# Patient Record
Sex: Female | Born: 1990 | Race: White | Hispanic: No | Marital: Married | Smoking: Never smoker
Health system: Southern US, Community
[De-identification: ages and names within clinical notes are randomized; demographics above are authoritative.]

## PROBLEM LIST (undated history)

## (undated) ENCOUNTER — Inpatient Hospital Stay (HOSPITAL_COMMUNITY): Payer: Self-pay

## (undated) DIAGNOSIS — Z862 Personal history of diseases of the blood and blood-forming organs and certain disorders involving the immune mechanism: Secondary | ICD-10-CM

## (undated) DIAGNOSIS — I499 Cardiac arrhythmia, unspecified: Secondary | ICD-10-CM

## (undated) DIAGNOSIS — H348192 Central retinal vein occlusion, unspecified eye, stable: Secondary | ICD-10-CM

## (undated) DIAGNOSIS — Z86718 Personal history of other venous thrombosis and embolism: Secondary | ICD-10-CM

## (undated) DIAGNOSIS — D649 Anemia, unspecified: Secondary | ICD-10-CM

## (undated) DIAGNOSIS — R87629 Unspecified abnormal cytological findings in specimens from vagina: Secondary | ICD-10-CM

## (undated) HISTORY — DX: Personal history of diseases of the blood and blood-forming organs and certain disorders involving the immune mechanism: Z86.2

## (undated) HISTORY — PX: MR LOWER LEG RIGHT (ARMC HX): HXRAD1785

---

## 2011-03-18 HISTORY — PX: DILATION AND CURETTAGE OF UTERUS: SHX78

## 2016-08-29 LAB — OB RESULTS CONSOLE HIV ANTIBODY (ROUTINE TESTING): HIV: NONREACTIVE

## 2016-08-29 LAB — OB RESULTS CONSOLE RPR: RPR: NONREACTIVE

## 2016-08-29 LAB — OB RESULTS CONSOLE ANTIBODY SCREEN: ANTIBODY SCREEN: NEGATIVE

## 2016-08-29 LAB — OB RESULTS CONSOLE ABO/RH: RH TYPE: POSITIVE

## 2016-08-29 LAB — OB RESULTS CONSOLE GC/CHLAMYDIA
Chlamydia: NEGATIVE
Gonorrhea: NEGATIVE

## 2016-08-29 LAB — OB RESULTS CONSOLE RUBELLA ANTIBODY, IGM: Rubella: IMMUNE

## 2016-08-29 LAB — OB RESULTS CONSOLE HEPATITIS B SURFACE ANTIGEN: Hepatitis B Surface Ag: NEGATIVE

## 2016-09-03 ENCOUNTER — Encounter (HOSPITAL_COMMUNITY): Payer: Self-pay | Admitting: *Deleted

## 2016-09-04 ENCOUNTER — Encounter (HOSPITAL_COMMUNITY): Payer: Self-pay

## 2016-09-09 ENCOUNTER — Ambulatory Visit (HOSPITAL_COMMUNITY)
Admission: RE | Admit: 2016-09-09 | Discharge: 2016-09-09 | Disposition: A | Discharge status: Home / Self Care | Payer: 59 | Source: Ambulatory Visit | Attending: Obstetrics & Gynecology | Admitting: Obstetrics & Gynecology

## 2016-09-09 ENCOUNTER — Encounter (HOSPITAL_COMMUNITY): Payer: Self-pay

## 2016-09-09 VITALS — BP 117/73 | HR 86 | Wt 180.6 lb

## 2016-09-09 DIAGNOSIS — H34812 Central retinal vein occlusion, left eye, with macular edema: Secondary | ICD-10-CM

## 2016-09-09 DIAGNOSIS — Z3A09 9 weeks gestation of pregnancy: Secondary | ICD-10-CM

## 2016-09-09 DIAGNOSIS — Z8249 Family history of ischemic heart disease and other diseases of the circulatory system: Secondary | ICD-10-CM

## 2016-09-09 DIAGNOSIS — Z87898 Personal history of other specified conditions: Secondary | ICD-10-CM | POA: Diagnosis not present

## 2016-09-09 DIAGNOSIS — Z3491 Encounter for supervision of normal pregnancy, unspecified, first trimester: Secondary | ICD-10-CM | POA: Insufficient documentation

## 2016-09-09 HISTORY — DX: Central retinal vein occlusion, unspecified eye, stable: H34.8192

## 2016-09-09 HISTORY — DX: Unspecified abnormal cytological findings in specimens from vagina: R87.629

## 2016-09-09 NOTE — Progress Notes (Signed)
Maternal Fetal Medicine Consultation  I had the pleasure of seeing your patient Krystal Benitez for Maternal-Fetal Medicine consultation on 09/09/2016. As you know, Krystal Benitez is a 26 y.o. G1P0 at [redacted]w[redacted]d who presents for consultation regarding a history of a retinal vein occlusion in the left eye.  Krystal Benitez is feeling well today. She reports some nausea vomiting in this pregnancy. Her medical history is significant for the onset of cloudy vision last year. She was seen by Dr. 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 She was treated with several intraocular injections of Eylea, an anti-VEGF therapy, with good response. Her last treatment was in December 2017 with no further treatment planned. She was last seen by Dr. Allyne Gee in April of this year. At the time of her initial evaluation a full thrombophilia evaluation was performed. I do not have the results of this but Krystal Benitez tells me it was negative. Krystal Benitez reports that her vision has been stable.   Krystal Benitez's family is notable for multiple family members with a history of VTE on her father's side. Her father had a significant history of VTE with multiple events starting at age 61. She reports his thrombophilia evaluation was also negative.  Krystal Benitez record also notes a history of elevated blood pressure in the fall of 2017. She reports this was self-limited and has never happened before or since. Her blood pressure today is normal.   The remainder of Krystal Benitez medical history is unremarkable. Her past surgical history is significant for leg decompression for compartment syndrome. She takes prenatal vitamins, Diclegis, aspirin 325mg , prenatal vitamins, and omeprazole. She tells me she is taking heparin twice daily but her notes indicate low-molecular weight heparin. She is allergic to ancef and azithromycin. [name] denies alcohol, tobacco or other drug  use. Her family history is unremarkable except as noted above.  We discussed the following issues during her visit today: History of central retinal vein occlusion: We discussed that Eylea is an anti-angiogenic medication which is contraindicated in pregnancy. Pregnancy should be delayed for at least three months following an Eylea injection. Given that Krystal Benitez's last injection occurred almost 5 months prior to conception this should not affect the current pregnancy. We discussed the changes in vision that may accompany normal pregnancy. I encouraged Krystal Benitez to keep her scheduled follow-up appointment with Dr. Allyne Gee in July and that she should be seen promptly if any new signs or symptoms arise.  We also discussed that retinal vein occlusion has been associated with a risk for hypertensive disorders of pregnancy. Particularly given Krystal Benitez's history of elevated blood pressure in the fall, I recommend clinical vigilance for signs and symptoms of preeclampsia and consideration of sending a baseline serum creatinine and spot urine protein at her next prenatal visit.    We also discussed the association between thrombophilias and retinal vein occlusion. While Krystal Benitez's thrombophilia evaluation was negative, given her family history we discussed the likelihood that she may carry an unrecognized inherited thrombophilia. I think it is reasonable to continue prophylactic anticoagulation in this case. We discussed the risks of anticoagulation in pregnancy and at delivery. Unless delivery is indicated earlier, I recommend a planned induction of labor at 39 weeks so that anticoagulation may be stopped the night prior. I recommend continuing prophylaxis postpartum for six weeks. Mode of delivery should be dictated by the usual obstetric indications. I do not see any contraindication to valsalva unless  her ophthalmologist feels otherwise.  Thank you for the opportunity to be a part of the care of [employer] Benitez. Please  contact our office if we can be of further assistance. We are happy to see Krystal Benitez again in the future particularly if her ophthalmologist recommends treatment in pregnancy or additional questions or concerns arise.   I spent approximately 40 minutes with this patient with over 50% of time spent in face-to-face counseling.  Darlyn ReadEmily Kylina Vultaggio, MD Maternal-Fetal Medicine

## 2016-09-30 ENCOUNTER — Encounter (HOSPITAL_COMMUNITY): Payer: Self-pay

## 2016-10-01 ENCOUNTER — Encounter (HOSPITAL_COMMUNITY): Payer: Self-pay

## 2017-01-26 ENCOUNTER — Inpatient Hospital Stay (HOSPITAL_COMMUNITY)
Admission: AD | Admit: 2017-01-26 | Discharge: 2017-01-26 | Disposition: A | Discharge status: Home / Self Care | Payer: 59 | Source: Ambulatory Visit | Attending: Obstetrics and Gynecology | Admitting: Obstetrics and Gynecology

## 2017-01-26 ENCOUNTER — Encounter (HOSPITAL_COMMUNITY): Payer: Self-pay | Admitting: *Deleted

## 2017-01-26 DIAGNOSIS — R0602 Shortness of breath: Secondary | ICD-10-CM | POA: Diagnosis not present

## 2017-01-26 DIAGNOSIS — I471 Supraventricular tachycardia: Secondary | ICD-10-CM

## 2017-01-26 DIAGNOSIS — O26893 Other specified pregnancy related conditions, third trimester: Secondary | ICD-10-CM | POA: Diagnosis not present

## 2017-01-26 DIAGNOSIS — Z3A29 29 weeks gestation of pregnancy: Secondary | ICD-10-CM | POA: Diagnosis not present

## 2017-01-26 DIAGNOSIS — Z86718 Personal history of other venous thrombosis and embolism: Secondary | ICD-10-CM | POA: Diagnosis not present

## 2017-01-26 DIAGNOSIS — O99419 Diseases of the circulatory system complicating pregnancy, unspecified trimester: Secondary | ICD-10-CM

## 2017-01-26 DIAGNOSIS — Z7982 Long term (current) use of aspirin: Secondary | ICD-10-CM | POA: Insufficient documentation

## 2017-01-26 DIAGNOSIS — R030 Elevated blood-pressure reading, without diagnosis of hypertension: Secondary | ICD-10-CM | POA: Diagnosis present

## 2017-01-26 LAB — URINALYSIS, ROUTINE W REFLEX MICROSCOPIC
Bilirubin Urine: NEGATIVE
GLUCOSE, UA: NEGATIVE mg/dL
Hgb urine dipstick: NEGATIVE
Ketones, ur: NEGATIVE mg/dL
LEUKOCYTES UA: NEGATIVE
Nitrite: NEGATIVE
PROTEIN: NEGATIVE mg/dL
Specific Gravity, Urine: 1.008 (ref 1.005–1.030)
pH: 7 (ref 5.0–8.0)

## 2017-01-26 NOTE — MAU Provider Note (Signed)
Chief Complaint:  exhausted; Tachycardia; and Hypertension   First Provider Initiated Contact with Patient 01/26/17 1106      HPI: Krystal Benitez is a 26 y.o. G2P0010 at 2329w3d who presents to maternity admissions sent from the office for recent tachycardia and HTN.  She had an elevated HR in the office today of 120 and her BP was elevated so labwork was drawn in the office and she was sent to MAU for EKG.  She reports that her BP has been elevated at recent office visits so she was asked by her doctor to take her blood pressure at home. She took it yesterday and her BP was normal but her heart rate was 150. She took it a few times and it took ~2 hours for the heart rate to go below 100.  She denies any shortness of breath, chest pain, or palpitations. She denies any change in symptoms during the time of elevated HR. She does report some recent mild shortness of breath, starting 2 weeks ago, both at rest and when ambulating, but she thinks this may be pregnancy related. She reports some fatigue also increasing in the last 2-3 weeks. She is not sure if these are associated with the elevated HR.  She has not tried any treatments. Nothing makes this symptom better or worse.  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 She reports good fetal movement, denies cramping/contractions, LOF, vaginal bleeding, vaginal itching/burning, urinary symptoms, h/a, dizziness, n/v, or fever/chills.    HPI  Past Medical History: Past Medical History:  Diagnosis Date  . Retinal vein occlusion   . Vaginal Pap smear, abnormal     Past obstetric history: OB History  Gravida Para Term Preterm AB Living  2 0 0 0 1 0  SAB TAB Ectopic Multiple Live Births  1 0 0 0 0    # Outcome Date GA Lbr Len/2nd Weight Sex Delivery Anes PTL Lv  2 Current           1 SAB               Past Surgical History: Past Surgical History:  Procedure Laterality Date  . MR LOWER LEG RIGHT ([facility] HX)     muscular surgery    Family  History: History reviewed. No pertinent family history.  Social History: Social History   Tobacco Use  . Smoking status: Never Smoker  . Smokeless tobacco: Never Used  Substance Use Topics  . Alcohol use: No  . Drug use: No    Allergies:  Allergies  Allergen Reactions  . Ancef [Cefazolin]   . Zithromax [Azithromycin]     Meds:  No medications prior to admission.    ROS:  Review of Systems  Constitutional: Negative for chills, fatigue and fever.  Eyes: Negative for visual disturbance.  Respiratory: Negative for shortness of breath.   Cardiovascular: Negative for chest pain.  Gastrointestinal: Negative for abdominal pain, nausea and vomiting.  Genitourinary: Negative for difficulty urinating, dysuria, flank pain, pelvic pain, vaginal bleeding, vaginal discharge and vaginal pain.  Neurological: Negative for dizziness and headaches.  Psychiatric/Behavioral: Negative.      I have reviewed patient's Past Medical Hx, Surgical Hx, Family Hx, Social Hx, medications and allergies.   Physical Exam   Patient Vitals for the past 24 hrs:  BP Temp Temp src Pulse Resp SpO2  01/26/17 1326 117/82 98.8 F (37.1 C) Oral 98 16 98 %  01/26/17 1145 120/83 - - (!) 111 - 97 %  01/26/17 1130 113/84 - - (!) 112 - 95 %  01/26/17 1125 - - - - - 95 %  01/26/17 1120 - - - - - 94 %  01/26/17 1115 121/82 - - (!) 103 - 95 %  01/26/17 1100 124/81 - - 100 - 96 %  01/26/17 1056 - 98.5 F (36.9 C) Oral - 16 95 %  01/26/17 1055 130/82 - - (!) 106 - 100 %   Constitutional: Well-developed, well-nourished female in no acute distress.  HEART: tachycardia at 107, normal heart sounds, regular rhythm RESP: normal effort, lung sounds clear and equal bilaterally GI: Abd soft, non-tender, gravid appropriate for gestational age.  MS: Extremities nontender, no edema, normal ROM Neurologic: Alert and oriented x 4.  GU: Neg CVAT.     EKG: normal EKG, normal sinus rhythm, sinus tachycardia.  FHT:   Baseline 145 , moderate variability, accelerations present, isolated variable x 1 followed by reactive NST with accels Contractions: None on toco or to palpation   Labs: Results for orders placed or performed during the hospital encounter of 01/26/17 (from the past 24 hour(s))  Urinalysis, Routine w reflex microscopic     Status: Abnormal   Collection Time: 01/26/17 10:48 AM  Result Value Ref Range   Color, Urine YELLOW YELLOW   APPearance HAZY (A) CLEAR   Specific Gravity, Urine 1.008 1.005 - 1.030   pH 7.0 5.0 - 8.0   Glucose, UA NEGATIVE NEGATIVE mg/dL   Hgb urine dipstick NEGATIVE NEGATIVE   Bilirubin Urine NEGATIVE NEGATIVE   Ketones, ur NEGATIVE NEGATIVE mg/dL   Protein, ur NEGATIVE NEGATIVE mg/dL   Nitrite NEGATIVE NEGATIVE   Leukocytes, UA NEGATIVE NEGATIVE      Imaging:  No results found.  MAU Course/MDM: I have ordered labs and reviewed results.  NST reviewed and reactive Pt without acute shortness of breath and HR of 100-110 in MAU.  EKG with NSR and mild tachycardia with HR 106.   Consult Dr Renaldo FiddlerAdkins with presentation, exam findings and test results.  F/U in office in 2 days as scheduled, return to MAU with worsening symptoms Pt discharge with strict shortness of breath/tachycardia precautions.    Assessment: 1. Supraventricular tachycardia during pregnancy (HCC)   2. Shortness of breath due to pregnancy in third trimester     Plan: Discharge home Labor precautions and fetal kick counts Follow-up Information    Zelphia CairoAdkins, Gretchen, MD Follow up.   Specialty:  Obstetrics and Gynecology Why:  As scheduled this week, return to MAU as needed for worsening symptoms or emergencies. Contact information: [address], [address] [place] [address] [phone]          Allergies as of 01/26/2017      Reactions   Ancef [cefazolin]    Zithromax [azithromycin]       Medication List    TAKE these medications   aspirin 325 MG tablet Take 325 mg by  mouth daily.   DICLEGIS PO Take by mouth.   OMEPRAZOLE PO Take by mouth.   PRENATAL VITAMIN PO Take by mouth.       Sharen CounterLisa Leftwich-Kirby Certified Nurse-Midwife 01/26/2017 5:00 PM

## 2017-01-26 NOTE — MAU Note (Signed)
Patient has been checking her BP at home and her HR last night was 150.  Has been feeling "exhausted" but not like her heart was racing.  Today in the office HR was 120.  Was sent over for "EKG".  Reports feeling good fetal movement. Denies pain.

## 2017-01-29 ENCOUNTER — Encounter: Payer: Self-pay | Admitting: *Deleted

## 2017-02-02 ENCOUNTER — Ambulatory Visit (INDEPENDENT_AMBULATORY_CARE_PROVIDER_SITE_OTHER): Payer: 59 | Admitting: Cardiovascular Disease

## 2017-02-02 ENCOUNTER — Encounter: Payer: Self-pay | Admitting: Cardiovascular Disease

## 2017-02-02 ENCOUNTER — Telehealth: Payer: Self-pay

## 2017-02-02 VITALS — BP 115/72 | HR 112 | Ht 62.0 in | Wt 174.8 lb

## 2017-02-02 DIAGNOSIS — H348122 Central retinal vein occlusion, left eye, stable: Secondary | ICD-10-CM

## 2017-02-02 DIAGNOSIS — R000 Tachycardia, unspecified: Secondary | ICD-10-CM

## 2017-02-02 DIAGNOSIS — R011 Cardiac murmur, unspecified: Secondary | ICD-10-CM

## 2017-02-02 NOTE — Telephone Encounter (Signed)
SENT NOTES TO SCHEDULING 

## 2017-02-02 NOTE — Patient Instructions (Signed)
Medication Instructions:  Your physician recommends that you continue on your current medications as directed. Please refer to the Current Medication list given to you today.  Testing/Procedures: Your physician has requested that you have an echocardiogram. Echocardiography is a painless test that uses sound waves to create images of your heart. It provides your doctor with information about the size and shape of your heart and how well your heart's chambers and valves are working. This procedure takes approximately one hour. There are no restrictions for this procedure. [facility]  [facility]  Follow-Up: Your physician recommends that you schedule a follow-up appointment in: 4-6 weeks    Any Other Special Instructions Will Be Listed Below (If Applicable).     If you need a refill on your cardiac medications before your next appointment, please call your pharmacy.

## 2017-02-02 NOTE — Progress Notes (Signed)
Cardiology Office Note    Date:  02/08/2017   ID:  Aynsley Fleet, DOB Apr 08, 1990, MRN [id number]  PCP:  Raynelle Jan., MD  Cardiologist:  Nicki Guadalajara, MD   Chief Complaint  Patient presents with  . New Patient (Initial Visit)   Cardiology consultation through the courtesy of Dr. Candice Camp for evaluation of tachycardia during pregnancy.  History of Present Illness:  Krystal Benitez is a 26 y.o. female who is pregnant with her first child and is referred through the courtesy of Dr. Candice Camp for cardiology consultation and evaluation of recent tachycardia.  Krystal Benitez admits to good health throughout her life.  She is pregnant with her first child with a due date of 04/10/2017.  She has a prior history of retinal vein occlusion and has been on heparin during her pregnancy. During her pregnancy, she has lost 10 pounds from her prepregnancy weight.  She admits to eating well.  On 01/26/2017, she was admitted from the office to maternity admissions for evaluation of increased heart rate and hypertension.  Reportedly her heart rate at increased to 120 in the office and her blood pressure was elevated.  Prior to that evaluation, she had noticed that her heart rate at home often would be over 100.  She denied any abrupt onset and abrupt discontinuance of this heart rate elevation.  She also admits to some fatigue over the last several weeks associated with the increased heart rate.  When she was evaluated at the MAU .  An ECG was done on 01/26/2017 , which was interpreted as possible ectopic atrial rhythm.  However, by my review, the is clear.  Arm lead reversal with negative P waves in lead 1 and aVL and actually she was in normal sinus rhythm at 97 beats per minute without ectopy and with normal intervals.  Presently, she denies any caffeine use.  She denies recent recurrent episodes of significant tachycardia palpitations.  She denies chest tightness.  She is referred for evaluation.  Of note,  the patient's family history is notable for a father who had suffered multiple DVTs and also had hypertension.   Past Medical History:  Diagnosis Date  . History of anemia as a child   . Retinal vein occlusion   . Vaginal Pap smear, abnormal     Past Surgical History:  Procedure Laterality Date  . DILATION AND CURETTAGE OF UTERUS  2013  . MR LOWER LEG RIGHT ([facility] HX)     muscular surgery    Current Medications: Outpatient Medications Prior to Visit  Medication Sig Dispense Refill  . aspirin 325 MG tablet Take 325 mg by mouth daily.    . heparin 40981 UNIT/ML injection Inject 1,250 Units 2 (two) times daily into the skin.  0  . OMEPRAZOLE PO Take by mouth.    . Prenatal Vit-Fe Fumarate-FA (PRENATAL VITAMIN PO) Take by mouth.    . Doxylamine-Pyridoxine (DICLEGIS PO) Take by mouth.     No facility-administered medications prior to visit.      Allergies:   Azithromycin and Cefazolin   Social History   Socioeconomic History  . Marital status: Single    Spouse name: None  . Number of children: None  . Years of education: None  . Highest education level: None  Social Needs  . Financial resource strain: None  . Food insecurity - worry: None  . Food insecurity - inability: None  . Transportation needs - medical: None  . Transportation needs - non-medical:  None  Occupational History  . None  Tobacco Use  . Smoking status: Never Smoker  . Smokeless tobacco: Never Used  Substance and Sexual Activity  . Alcohol use: No  . Drug use: No  . Sexual activity: Yes  Other Topics Concern  . None  Social History Narrative  . None     Family History:  The patient's family history includes COPD in her maternal grandfather; Clotting disorder in her father; Heart disease in her father; Hypertension in her father; Rheum arthritis in her paternal grandmother.   ROS General: Negative; No fevers, chills, or night sweats;  HEENT: Negative; No changes in vision or hearing, sinus  congestion, difficulty swallowing Pulmonary: Negative; No cough, wheezing, shortness of breath, hemoptysis Cardiovascular: See history of present illness GI: Negative; No nausea, vomiting, diarrhea, or abdominal pain GU: Negative; No dysuria, hematuria, or difficulty voiding Musculoskeletal: Negative; no myalgias, joint pain, or weakness Hematologic/Oncology: Negative; no easy bruising, bleeding Endocrine: Negative; no heat/cold intolerance; no diabetes Neuro: Negative; no changes in balance, headaches Skin: Negative; No rashes or skin lesions Psychiatric: Negative; No behavioral problems, depression Sleep: Negative; No snoring, daytime sleepiness, hypersomnolence, bruxism, restless legs, hypnogognic hallucinations, no cataplexy Other comprehensive 14 point system review is negative.   PHYSICAL EXAM:   VS:  BP 115/72   Pulse (!) 112   Ht 5\' 2"  (1.575 m)   Wt 174 lb 12.8 oz (79.3 kg)   LMP 07/04/2016   BMI 31.97 kg/m     Repeat blood pressure by me was 108/70 supine and 106/70 standing.  Wt Readings from Last 3 Encounters:  02/02/17 174 lb 12.8 oz (79.3 kg)  09/09/16 180 lb 9.6 oz (81.9 kg)    General: Alert, oriented, no distress.  Skin: normal turgor, no rashes, warm and dry HEENT: Normocephalic, atraumatic. Pupils equal round and reactive to light; sclera anicteric; extraocular muscles intact; Fundi .  She has a history of left retinal vein occlusion. Nose without nasal septal hypertrophy Mouth/Parynx benign; Mallinpatti scale 2 Neck: No JVD, no carotid bruits; normal carotid upstroke Lungs: clear to ausculatation and percussion; no wheezing or rales Chest wall: without tenderness to palpitation Heart: PMI not displaced, RRR, s1 s2 normal, 1/6 systolic murmur along the left sternal border, no diastolic murmur, no rubs, gallops, thrills, or heaves Abdomen: soft, nontender; no hepatosplenomehaly, BS+; abdominal aorta nontender and not dilated by palpation. .  She is [redacted] weeks  pregnant. Back: no CVA tenderness Pulses 2+ Musculoskeletal: full range of motion, normal strength, no joint deformities Extremities: no clubbing cyanosis or edema, Homan's sign negative  Neurologic: grossly nonfocal; Cranial nerves grossly wnl Psychologic: Normal mood and affect   Studies/Labs Reviewed:   EKG:  EKG is ordered today.  ECG (independently read by me): Sinus tachycardia 112 bpm.  Normal intervals.  No evidence for preexcitation.  Normal axis.  No ST segment changes.  No ectopy.  I personally reviewed the ECG that was done 01/26/2017 at the MAU:  There is arm lead reversal with an inverted P wave in lead 1.  Rightward axis secondary to lead reversal.  There is normal sinus rhythm at 97 bpm.  QTc interval 419 ms.  PR interval 128 ms.  Recent Labs: No flowsheet data found.   No flowsheet data found.  No flowsheet data found. No results found for: MCV No results found for: TSH No results found for: HGBA1C   BNP No results found for: BNP  ProBNP No results found for: PROBNP   Lipid  Panel  No results found for: CHOL, TRIG, HDL, CHOLHDL, VLDL, LDLCALC, LDLDIRECT   RADIOLOGY: No results found.   Additional studies/ records that were reviewed today include:  I reviewed the office records from Dr. Candice Campavid Lowe at [facility].  I reviewed the maternal ambulatory unit evaluation from 01/26/2017.  ASSESSMENT:    1. Tachycardia   2. Murmur, cardiac   3. Retinal vein occlusion of left eye, unspecified retinal vein     PLAN:  Krystal Benitez is a healthy-appearing 26 year old female who is in her second pregnancy after having a miscarriage 6 years ago.  She is currently [redacted] weeks pregnant with a due date of April 10 2017.  She has a history of prior left retinal vein occlusion for which she was placed on heparin therapy.  The latter stages of her pregnancy.  She denies any history of prior tachycardia, presyncope or syncope or history of SVT.  She had  recently developed increased heart rate with tachycardia with heart rates in the 120s with questionable episode of heart rate at 150.  She denies any abrupt onset or abrupt discontinuation arguing against SVT.  There is no evidence for preexcitation on her ECG.  Her ECG from 01/26/2017 had arm lead reversal and at that time she was in sinus rhythm and not ectopic atrial tachycardia.  She has been avoiding caffeine.  She has not had weight gain but actually admits to a 10 pound weight loss during her pregnancy.  She does have a 1/6 systolic murmur, which I suspect is most likely due to the increased blood volume associated with her pregnancy.  I scheduled her for 2-D echo Doppler study to make certain there is no evidence for structural heart disease.  At this time, I would not initiate any therapy for her tachycardia.  Her blood pressure is normal and there is no suggestion of preeclampsia at present.  I will see her back in the office in 4-6 weeks for follow-up evaluation.  If she experiences recurrent episodes of increasing heart rate.  She will contact us for sooner assessment.   Medication Adjustments/Labs and Tests Ordered: Current medicines are reviewed at length with the patient today.  Concerns regarding medicines are outlined above.  Medication changes, Labs and Tests ordered today are listed in the Patient Instructions below. Patient Instructions  Medication Instructions:  Your physician recommends that you continue on your current medications as directed. Please refer to the Current Medication list given to you today.  Testing/Procedures: Your physician has requested that you have an echocardiogram. Echocardiography is a painless test that uses sound waves to create images of your heart. It provides your doctor with information about the size and shape of your heart and how well your heart's chambers and valves are working. This procedure takes approximately one hour. There are no restrictions  for this procedure. [facility]  [facility]  Follow-Up: Your physician recommends that you schedule a follow-up appointment in: 4-6 weeks    Any Other Special Instructions Will Be Listed Below (If Applicable).     If you need a refill on your cardiac medications before your next appointment, please call your pharmacy.      Signed, Nicki Guadalajarahomas Marchello Rothgeb, MD  02/08/2017 9:11 AM    [facility] [address], [address], [place], [place]  [address] Phone: [phone]

## 2017-02-03 ENCOUNTER — Ambulatory Visit: Payer: 59 | Admitting: Cardiovascular Disease

## 2017-02-08 ENCOUNTER — Encounter: Payer: Self-pay | Admitting: Cardiovascular Disease

## 2017-02-23 ENCOUNTER — Other Ambulatory Visit (HOSPITAL_COMMUNITY): Payer: 59

## 2017-03-04 ENCOUNTER — Other Ambulatory Visit: Payer: Self-pay

## 2017-03-04 ENCOUNTER — Telehealth: Payer: Self-pay | Admitting: Cardiovascular Disease

## 2017-03-04 ENCOUNTER — Ambulatory Visit (HOSPITAL_COMMUNITY): Payer: 59 | Attending: Internal Medicine

## 2017-03-04 DIAGNOSIS — R000 Tachycardia, unspecified: Secondary | ICD-10-CM | POA: Insufficient documentation

## 2017-03-04 DIAGNOSIS — R011 Cardiac murmur, unspecified: Secondary | ICD-10-CM | POA: Diagnosis not present

## 2017-03-04 NOTE — Telephone Encounter (Signed)
Called patient and left a VM to call back to schedule her followup after her echo with Dr. Tresa EndoKelly (if available) or one of the PA's on Dr. Landry DykeKelly's team.

## 2017-03-09 ENCOUNTER — Telehealth: Payer: Self-pay | Admitting: Cardiovascular Disease

## 2017-03-09 NOTE — Telephone Encounter (Signed)
Called patient and LVM to call back and schedule 6 week followup with Dr. Tresa EndoKelly or APP.

## 2017-03-11 ENCOUNTER — Telehealth: Payer: Self-pay | Admitting: *Deleted

## 2017-03-11 NOTE — Telephone Encounter (Signed)
-----   Message from Lennette Biharihomas A Kelly, MD sent at 03/06/2017  1:29 PM EST ----- Normal echo Doppler study

## 2017-03-11 NOTE — Telephone Encounter (Signed)
Notes recorded by Lennette BihariKelly, Thomas A, MD on 03/06/2017 at 1:29 PM EST Normal echo Doppler study   Patient aware and verbalized understanding.  Patient wondering if follow up OV is needed.  Advised I am unsure if it is needed since it is normal but would let Dr. Tresa EndoKelly review and give recommendations.   Patient aware we will call if OV is recommended.

## 2017-03-12 NOTE — Telephone Encounter (Signed)
NO need for ov; can follow PRN

## 2017-03-13 NOTE — Telephone Encounter (Signed)
Patient is aware 

## 2017-03-23 ENCOUNTER — Encounter (HOSPITAL_COMMUNITY): Payer: Self-pay

## 2017-03-24 ENCOUNTER — Telehealth (HOSPITAL_COMMUNITY): Payer: Self-pay | Admitting: *Deleted

## 2017-03-24 NOTE — Telephone Encounter (Signed)
Preadmission screen  

## 2017-03-25 ENCOUNTER — Telehealth (HOSPITAL_COMMUNITY): Payer: Self-pay | Admitting: *Deleted

## 2017-03-25 NOTE — Telephone Encounter (Signed)
Preadmission screen  

## 2017-03-26 ENCOUNTER — Telehealth (HOSPITAL_COMMUNITY): Payer: Self-pay | Admitting: *Deleted

## 2017-03-26 NOTE — Telephone Encounter (Signed)
Preadmission screen  

## 2017-03-27 ENCOUNTER — Encounter (HOSPITAL_COMMUNITY): Payer: Self-pay

## 2017-03-27 NOTE — Patient Instructions (Addendum)
Krystal Benitez  03/27/2017   Your procedure is scheduled on:  04/03/2017  [facility] through [facility] at 1200 PM.  Pick up the phone at the desk and dial [phone]  Call this number if you have problems the morning of surgery:[phone]  Remember:   Do not eat food:After Midnight.  Do not drink clear liquids: After Midnight.  Take these medicines the morning of surgery with A SIP OF WATER: take your heparin on 1/17 as usual but do not take any heparin on day of surgery. You may take your prilosec if you wish to.   Do not wear jewelry, make-up or nail polish.  Do not wear lotions, powders, or perfumes. Do not wear deodorant.  Do not shave 48 hours prior to surgery.  Do not bring valuables to the hospital.  [facility] is not   responsible for any belongings or valuables brought to the hospital.  Contacts, dentures or bridgework may not be worn into surgery.  Leave suitcase in the car. After surgery it may be brought to your room.  For patients admitted to the hospital, checkout time is 11:00 AM the day of              discharge.    N/A   Please read over the following fact sheets that you were given:   Surgical Site Infection Prevention

## 2017-03-27 NOTE — Pre-Procedure Instructions (Signed)
Discussed pt heparin history and medical history with Dr Mal AmabileBrock today.  Orders received to do a PT/PTT on the day of surgery.  The pt is to take both of her doses of heparin the day before surgery but hold the dose the morning of surgery.  I discussed this plan with the patient.  She voiced concerns because the plan is different than the one her OB had discussed.  Told pt I would contact her OB and let her know the plan.  Dr Langston MaskerMorris' voicemail was full.  Left message on the schedulers voice mail and will follow up with office on Monday.

## 2017-03-27 NOTE — Pre-Procedure Instructions (Signed)
Contacted Dr Langston MaskerMorris updated to POC of Anesthesia.  Pt notified of Dr Langston MaskerMorris approval of POC.

## 2017-03-28 NOTE — H&P (Addendum)
Krystal Benitez is a 27 y.o. female presenting for primary C/S secondary to breech presentation; patient declines ECV.  Antepartum course complicated by history of retinal vein occlusion (negative hypercoagulability w/u) for which she has taken ppx unfractionated heparin and daily aspirin.  She received chemo treatment Connye Burkitt([place]) through 02/2016 and MFM concluded no risk to pregnancy.  The patient was was evaluated by endocrinology for possible thyroid disease. Ultimately, she was diagnosed with dysthymia and no meds were started.  GBS negative.  OB History    Gravida Para Term Preterm AB Living   2 0 0 0 1 0   SAB TAB Ectopic Multiple Live Births   1 0 0 0 0     Past Medical History:  Diagnosis Date  . Anemia   . Dysrhythmia    tachycardia  . History of anemia as a child   . Hx of blood clots   . Retinal vein occlusion   . Vaginal Pap smear, abnormal    Past Surgical History:  Procedure Laterality Date  . DILATION AND CURETTAGE OF UTERUS  2013  . MR LOWER LEG RIGHT ([facility] HX)     muscular surgery   Family History: family history includes COPD in her maternal grandfather; Clotting disorder in her father; Heart disease in her father; Hypertension in her father; Pancreatic cancer in her paternal grandmother; Rheum arthritis in her paternal grandmother. Social History:  reports that  has never smoked. she has never used smokeless tobacco. She reports that she does not drink alcohol or use drugs.     Maternal Diabetes: No Genetic Screening: Normal Maternal Ultrasounds/Referrals: Normal Fetal Ultrasounds or other Referrals:  Referred to Materal Fetal Medicine  Maternal Substance Abuse:  No Significant Maternal Medications:  Meds include: Other: unfractionated heparin Significant Maternal Lab Results:  Lab values include: Group B Strep negative Other Comments:  None  ROS Maternal Medical History:  Prenatal complications: Thrombophilia.   Prenatal Complications - Diabetes:  none.      Last menstrual period 07/04/2016. Maternal Exam:  Abdomen: Fundal height is c/w dates.   Estimated fetal weight is 7#.   Fetal presentation: breech     Physical Exam  Constitutional: She is oriented to person, place, and time. She appears well-developed and well-nourished.  GI: Soft. There is no tenderness. There is no rebound.  Neurological: She is alert and oriented to person, place, and time.  Skin: Skin is warm and dry.  Psychiatric: She has a normal mood and affect. Her behavior is normal.    Prenatal labs: ABO, Rh: O/Positive/-- (06/15 0000) Antibody: Negative (06/15 0000) Rubella: Immune (06/15 0000) RPR: Nonreactive (06/15 0000)  HBsAg: Negative (06/15 0000)  HIV: Non-reactive (06/15 0000)  GBS:     Assessment/Plan: 26yo G2P0010 at 39 weeks for primary C/S -Patient has been counseled re: risk of bleeding, infection, scarring, and damage to surrounding structures.  She understands implications in future pregnancies; specifically abnormal placentation and uterine rupture.  All questions were answered and the patient wishes to proceed. -Plan ufh x 6 weeks ppx (10,000U sq q 12)   Tamiki Kuba 03/28/2017, 9:52 PM

## 2017-03-29 ENCOUNTER — Inpatient Hospital Stay (HOSPITAL_COMMUNITY)
Admission: AD | Admit: 2017-03-29 | Discharge: 2017-03-30 | Disposition: A | Discharge status: Home / Self Care | Payer: BLUE CROSS/BLUE SHIELD | Source: Ambulatory Visit | Attending: Obstetrics and Gynecology | Admitting: Obstetrics and Gynecology

## 2017-03-29 ENCOUNTER — Encounter (HOSPITAL_COMMUNITY): Payer: Self-pay | Admitting: *Deleted

## 2017-03-29 DIAGNOSIS — Z3A38 38 weeks gestation of pregnancy: Secondary | ICD-10-CM | POA: Insufficient documentation

## 2017-03-29 DIAGNOSIS — O368130 Decreased fetal movements, third trimester, not applicable or unspecified: Secondary | ICD-10-CM | POA: Insufficient documentation

## 2017-03-29 DIAGNOSIS — Z86718 Personal history of other venous thrombosis and embolism: Secondary | ICD-10-CM | POA: Insufficient documentation

## 2017-03-29 DIAGNOSIS — Z7982 Long term (current) use of aspirin: Secondary | ICD-10-CM | POA: Insufficient documentation

## 2017-03-29 NOTE — MAU Note (Signed)
Urine sent to lab 

## 2017-03-29 NOTE — MAU Note (Signed)
Pt reports not feeling any fetal movement for the past 24 hours.  She denies any vag bleeding, leaking or pain anywhere.

## 2017-03-29 NOTE — MAU Provider Note (Signed)
History     CSN: [id number]  Arrival date and time: 03/29/17 2308   First Provider Initiated Contact with Patient 03/29/17 2348     Chief Complaint  Patient presents with  . Decreased Fetal Movement   HPI Krystal Benitez is a 27 y.o. G2P0010 at 5023w2d who presents stating she hasn't felt the baby move in the last 24 hours. She states she noticed at 2130 that she hadn't felt the baby move since last night so she waited to see if the baby started moving and has felt nothing. She denies any vaginal bleeding or discharge. Denies leaking of fluid. Reports intermittent contractions. She is scheduled for a primary c/s on 1/18 for breech presentation.  OB History    Gravida Para Term Preterm AB Living   2 0 0 0 1 0   SAB TAB Ectopic Multiple Live Births   1 0 0 0 0      Past Medical History:  Diagnosis Date  . Anemia   . Dysrhythmia    tachycardia  . History of anemia as a child   . Hx of blood clots   . Retinal vein occlusion   . Vaginal Pap smear, abnormal     Past Surgical History:  Procedure Laterality Date  . DILATION AND CURETTAGE OF UTERUS  2013  . MR LOWER LEG RIGHT ([facility] HX)     muscular surgery    Family History  Problem Relation Age of Onset  . Heart disease Father   . Hypertension Father   . Clotting disorder Father        MULTIPLE BLOOD CLOTS. ON HEPARIN , ASA  . COPD Maternal Grandfather   . Rheum arthritis Paternal Grandmother   . Pancreatic cancer Paternal Grandmother     Social History   Tobacco Use  . Smoking status: Never Smoker  . Smokeless tobacco: Never Used  Substance Use Topics  . Alcohol use: No  . Drug use: No    Allergies:  Allergies  Allergen Reactions  . Azithromycin Hives  . Cefazolin Hives and Rash    Medications Prior to Admission  Medication Sig Dispense Refill Last Dose  . acetaminophen (TYLENOL) 325 MG tablet Take 650 mg by mouth 2 (two) times daily as needed for moderate pain or headache.   Past Month at Unknown time  .  aspirin 325 MG tablet Take 325 mg by mouth daily.   03/29/2017 at Unknown time  . doxylamine, Sleep, (UNISOM) 25 MG tablet Take 25 mg by mouth at bedtime as needed for sleep.   03/29/2017 at Unknown time  . heparin [id number] UNIT/ML injection Inject 12,500 Units into the skin every 12 (twelve) hours.   0 03/29/2017 at Unknown time  . omeprazole (PRILOSEC) 40 MG capsule Take 40 mg by mouth daily as needed (acid reflux).   03/28/2017 at Unknown time  . Prenatal Vit-Fe Fumarate-FA (PRENATAL VITAMIN PO) Take 1 tablet by mouth daily.    03/29/2017 at Unknown time    Review of Systems  Constitutional: Negative.  Negative for fatigue and fever.  HENT: Negative.   Respiratory: Negative.  Negative for shortness of breath.   Cardiovascular: Negative.  Negative for chest pain.  Gastrointestinal: Negative.  Negative for abdominal pain, constipation, diarrhea, nausea and vomiting.  Genitourinary: Negative.  Negative for dysuria.  Neurological: Negative.  Negative for dizziness and headaches.   Physical Exam   Blood pressure 122/85, pulse 96, temperature 97.8 F (36.6 C), temperature source Oral, resp. rate 16, height 5'  2" (1.575 m), weight 177 lb (80.3 kg), last menstrual period 07/04/2016.  Physical Exam  Nursing note and vitals reviewed. Constitutional: She is oriented to person, place, and time. She appears well-developed and well-nourished. No distress.  HENT:  Head: Normocephalic.  Eyes: Pupils are equal, round, and reactive to light.  Cardiovascular: Normal rate, regular rhythm and normal heart sounds.  Respiratory: Effort normal and breath sounds normal. No respiratory distress.  GI: Soft. Bowel sounds are normal. She exhibits no distension. There is no tenderness.  Neurological: She is alert and oriented to person, place, and time.  Skin: Skin is warm and dry.  Psychiatric: She has a normal mood and affect. Her behavior is normal. Judgment and thought content normal.   Fetal  Tracing:  Baseline: 120 Variability: moderate Accels: 15x15 Decels: none  Toco: irregular uc's  MAU Course  Procedures  MDM NST- reactive Patient reports normal fetal movement while on monitor in MAU Reviewed with Dr. Vincente Poli- ok to discharge patient home to return for c/s Assessment and Plan   1. Decreased fetal movements in third trimester, single or unspecified fetus   2. [redacted] weeks gestation of pregnancy    -Discharge home in stable condition -Fetal kick counts discussed -Patient advised to follow-[facility] with [facility] on Friday for scheduled c/s -Patient may return to MAU as needed or if her condition were to change or worsen   Rolm Bookbinder CNM 03/30/2017, 12:22 AM

## 2017-03-30 DIAGNOSIS — Z7982 Long term (current) use of aspirin: Secondary | ICD-10-CM | POA: Diagnosis not present

## 2017-03-30 DIAGNOSIS — Z86718 Personal history of other venous thrombosis and embolism: Secondary | ICD-10-CM | POA: Diagnosis not present

## 2017-03-30 DIAGNOSIS — O368130 Decreased fetal movements, third trimester, not applicable or unspecified: Secondary | ICD-10-CM

## 2017-03-30 DIAGNOSIS — Z3A38 38 weeks gestation of pregnancy: Secondary | ICD-10-CM | POA: Diagnosis not present

## 2017-03-30 NOTE — Discharge Instructions (Signed)
Braxton Hicks Contractions °Contractions of the uterus can occur throughout pregnancy, but they are not always a sign that you are in labor. You may have practice contractions called Braxton Hicks contractions. These false labor contractions are sometimes confused with true labor. °What are Braxton Hicks contractions? °Braxton Hicks contractions are tightening movements that occur in the muscles of the uterus before labor. Unlike true labor contractions, these contractions do not result in opening (dilation) and thinning of the cervix. Toward the end of pregnancy (32-34 weeks), Braxton Hicks contractions can happen more often and may become stronger. These contractions are sometimes difficult to tell apart from true labor because they can be very uncomfortable. You should not feel embarrassed if you go to the hospital with false labor. °Sometimes, the only way to tell if you are in true labor is for your health care provider to look for changes in the cervix. The health care provider will do a physical exam and may monitor your contractions. If you are not in true labor, the exam should show that your cervix is not dilating and your water has not broken. °If there are other health problems associated with your pregnancy, it is completely safe for you to be sent home with false labor. You may continue to have Braxton Hicks contractions until you go into true labor. °How to tell the difference between true labor and false labor °True labor °· Contractions last 30-70 seconds. °· Contractions become very regular. °· Discomfort is usually felt in the top of the uterus, and it spreads to the lower abdomen and low back. °· Contractions do not go away with walking. °· Contractions usually become more intense and increase in frequency. °· The cervix dilates and gets thinner. °False labor °· Contractions are usually shorter and not as strong as true labor contractions. °· Contractions are usually irregular. °· Contractions  are often felt in the front of the lower abdomen and in the groin. °· Contractions may go away when you walk around or change positions while lying down. °· Contractions get weaker and are shorter-lasting as time goes on. °· The cervix usually does not dilate or become thin. °Follow these instructions at home: °· Take over-the-counter and prescription medicines only as told by your health care provider. °· Keep up with your usual exercises and follow other instructions from your health care provider. °· Eat and drink lightly if you think you are going into labor. °· If Braxton Hicks contractions are making you uncomfortable: °? Change your position from lying down or resting to walking, or change from walking to resting. °? Sit and rest in a tub of warm water. °? Drink enough fluid to keep your urine pale yellow. Dehydration may cause these contractions. °? Do slow and deep breathing several times an hour. °· Keep all follow-up prenatal visits as told by your health care provider. This is important. °Contact a health care provider if: °· You have a fever. °· You have continuous pain in your abdomen. °Get help right away if: °· Your contractions become stronger, more regular, and closer together. °· You have fluid leaking or gushing from your vagina. °· You pass blood-tinged mucus (bloody show). °· You have bleeding from your vagina. °· You have low back pain that you never had before. °· You feel your baby’s head pushing down and causing pelvic pressure. °· Your baby is not moving inside you as much as it used to. °Summary °· Contractions that occur before labor are called Braxton   Hicks contractions, false labor, or practice contractions. °· Braxton Hicks contractions are usually shorter, weaker, farther apart, and less regular than true labor contractions. True labor contractions usually become progressively stronger and regular and they become more frequent. °· Manage discomfort from Braxton Hicks contractions by  changing position, resting in a warm bath, drinking plenty of water, or practicing deep breathing. °This information is not intended to replace advice given to you by your health care provider. Make sure you discuss any questions you have with your health care provider. °Document Released: 07/17/2016 Document Revised: 07/17/2016 Document Reviewed: 07/17/2016 °Elsevier Interactive Patient Education © 2018 [employer] ° °Fetal Movement Counts °Patient Name: ________________________________________________ Patient Due Date: ____________________ °What is a fetal movement count? °A fetal movement count is the number of times that you feel your baby move during a certain amount of time. This may also be called a fetal kick count. A fetal movement count is recommended for every pregnant woman. You may be asked to start counting fetal movements as early as week 28 of your pregnancy. °Pay attention to when your baby is most active. You may notice your baby's sleep and wake cycles. You may also notice things that make your baby move more. You should do a fetal movement count: °· When your baby is normally most active. °· At the same time each day. ° °A good time to count movements is while you are resting, after having something to eat and drink. °How do I count fetal movements? °1. Find a quiet, comfortable area. Sit, or lie down on your side. °2. Write down the date, the start time and stop time, and the number of movements that you felt between those two times. Take this information with you to your health care visits. °3. For 2 hours, count kicks, flutters, swishes, rolls, and jabs. You should feel at least 10 movements during 2 hours. °4. You may stop counting after you have felt 10 movements. °5. If you do not feel 10 movements in 2 hours, have something to eat and drink. Then, keep resting and counting for 1 hour. If you feel at least 4 movements during that hour, you may stop counting. °Contact a health care  provider if: °· You feel fewer than 4 movements in 2 hours. °· Your baby is not moving like he or she usually does. °Date: ____________ Start time: ____________ Stop time: ____________ Movements: ____________ °Date: ____________ Start time: ____________ Stop time: ____________ Movements: ____________ °Date: ____________ Start time: ____________ Stop time: ____________ Movements: ____________ °Date: ____________ Start time: ____________ Stop time: ____________ Movements: ____________ °Date: ____________ Start time: ____________ Stop time: ____________ Movements: ____________ °Date: ____________ Start time: ____________ Stop time: ____________ Movements: ____________ °Date: ____________ Start time: ____________ Stop time: ____________ Movements: ____________ °Date: ____________ Start time: ____________ Stop time: ____________ Movements: ____________ °Date: ____________ Start time: ____________ Stop time: ____________ Movements: ____________ °This information is not intended to replace advice given to you by your health care provider. Make sure you discuss any questions you have with your health care provider. °Document Released: 04/02/2006 Document Revised: 10/31/2015 Document Reviewed: 04/12/2015 °Elsevier Interactive Patient Education © 2018 [employer] ° °

## 2017-04-02 ENCOUNTER — Encounter (HOSPITAL_COMMUNITY)
Admission: RE | Admit: 2017-04-02 | Discharge: 2017-04-02 | Disposition: A | Discharge status: Home / Self Care | Payer: BLUE CROSS/BLUE SHIELD | Source: Ambulatory Visit | Attending: Obstetrics & Gynecology | Admitting: Obstetrics & Gynecology

## 2017-04-02 HISTORY — DX: Personal history of other venous thrombosis and embolism: Z86.718

## 2017-04-02 HISTORY — DX: Anemia, unspecified: D64.9

## 2017-04-02 HISTORY — DX: Cardiac arrhythmia, unspecified: I49.9

## 2017-04-02 LAB — CBC
HEMATOCRIT: 38.5 % (ref 36.0–46.0)
Hemoglobin: 13.2 g/dL (ref 12.0–15.0)
MCH: 28.6 pg (ref 26.0–34.0)
MCHC: 34.3 g/dL (ref 30.0–36.0)
MCV: 83.5 fL (ref 78.0–100.0)
PLATELETS: 266 10*3/uL (ref 150–400)
RBC: 4.61 MIL/uL (ref 3.87–5.11)
RDW: 15.7 % — ABNORMAL HIGH (ref 11.5–15.5)
WBC: 12.1 10*3/uL — AB (ref 4.0–10.5)

## 2017-04-02 LAB — TYPE AND SCREEN
ABO/RH(D): O POS
ANTIBODY SCREEN: NEGATIVE

## 2017-04-02 LAB — ABO/RH: ABO/RH(D): O POS

## 2017-04-03 ENCOUNTER — Encounter (HOSPITAL_COMMUNITY): Payer: Self-pay | Admitting: *Deleted

## 2017-04-03 ENCOUNTER — Inpatient Hospital Stay (HOSPITAL_COMMUNITY): Payer: BLUE CROSS/BLUE SHIELD | Admitting: Certified Registered"

## 2017-04-03 ENCOUNTER — Encounter (HOSPITAL_COMMUNITY)
Admission: AD | Disposition: A | Discharge status: Home / Self Care | Payer: Self-pay | Source: Ambulatory Visit | Attending: Obstetrics & Gynecology

## 2017-04-03 ENCOUNTER — Inpatient Hospital Stay (HOSPITAL_COMMUNITY)
Admission: AD | Admit: 2017-04-03 | Discharge: 2017-04-06 | DRG: 788 | Disposition: A | Discharge status: Home / Self Care | Payer: BLUE CROSS/BLUE SHIELD | Source: Ambulatory Visit | Attending: Obstetrics & Gynecology | Admitting: Obstetrics & Gynecology

## 2017-04-03 DIAGNOSIS — O9952 Diseases of the respiratory system complicating childbirth: Secondary | ICD-10-CM | POA: Diagnosis present

## 2017-04-03 DIAGNOSIS — O321XX0 Maternal care for breech presentation, not applicable or unspecified: Secondary | ICD-10-CM | POA: Diagnosis present

## 2017-04-03 DIAGNOSIS — J019 Acute sinusitis, unspecified: Secondary | ICD-10-CM | POA: Diagnosis present

## 2017-04-03 DIAGNOSIS — Z3A39 39 weeks gestation of pregnancy: Secondary | ICD-10-CM | POA: Diagnosis not present

## 2017-04-03 DIAGNOSIS — Z98891 History of uterine scar from previous surgery: Secondary | ICD-10-CM

## 2017-04-03 LAB — CBC
HCT: 32.7 % — ABNORMAL LOW (ref 36.0–46.0)
Hemoglobin: 11.3 g/dL — ABNORMAL LOW (ref 12.0–15.0)
MCH: 29 pg (ref 26.0–34.0)
MCHC: 34.6 g/dL (ref 30.0–36.0)
MCV: 84.1 fL (ref 78.0–100.0)
PLATELETS: 227 10*3/uL (ref 150–400)
RBC: 3.89 MIL/uL (ref 3.87–5.11)
RDW: 15.5 % (ref 11.5–15.5)
WBC: 13.9 10*3/uL — ABNORMAL HIGH (ref 4.0–10.5)

## 2017-04-03 LAB — CREATININE, SERUM
Creatinine, Ser: 0.69 mg/dL (ref 0.44–1.00)
GFR calc Af Amer: >60 mL/min (ref >60)
GFR calc non Af Amer: >60 mL/min (ref >60)

## 2017-04-03 LAB — RPR: RPR Ser Ql: NONREACTIVE

## 2017-04-03 SURGERY — Surgical Case
Anesthesia: Spinal | Site: Abdomen | Wound class: Clean Contaminated

## 2017-04-03 MED ORDER — BUPIVACAINE IN DEXTROSE 0.75-8.25 % IT SOLN
INTRATHECAL | Status: DC | PRN
  Administered 2017-04-03: 1.4 mL via INTRATHECAL

## 2017-04-03 MED ORDER — NALBUPHINE HCL 10 MG/ML IJ SOLN: 5.0000 mg | Freq: Once | INTRAMUSCULAR | Status: DC | PRN

## 2017-04-03 MED ORDER — LACTATED RINGERS IV SOLN
INTRAVENOUS | Status: DC
  Administered 2017-04-03 – 2017-04-04 (×2): via INTRAVENOUS

## 2017-04-03 MED ORDER — KETOROLAC TROMETHAMINE 30 MG/ML IJ SOLN: 30.0000 mg | Freq: Four times a day (QID) | INTRAMUSCULAR | Status: AC | PRN

## 2017-04-03 MED ORDER — SENNOSIDES-DOCUSATE SODIUM 8.6-50 MG PO TABS
2.0000 | ORAL_TABLET | ORAL | Status: DC
  Administered 2017-04-04 – 2017-04-05 (×3): 2 via ORAL
  Filled 2017-04-03 (×3): qty 2

## 2017-04-03 MED ORDER — TETANUS-DIPHTH-ACELL PERTUSSIS 5-2.5-18.5 LF-MCG/0.5 IM SUSP: 0.5000 mL | Freq: Once | INTRAMUSCULAR | Status: DC

## 2017-04-03 MED ORDER — FENTANYL CITRATE (PF) 100 MCG/2ML IJ SOLN
INTRAMUSCULAR | Status: AC
  Filled 2017-04-03: qty 2

## 2017-04-03 MED ORDER — PROMETHAZINE HCL 25 MG/ML IJ SOLN: 6.2500 mg | INTRAMUSCULAR | Status: DC | PRN

## 2017-04-03 MED ORDER — ACETAMINOPHEN 500 MG PO TABS
1000.0000 mg | ORAL_TABLET | Freq: Four times a day (QID) | ORAL | Status: AC
  Administered 2017-04-03 – 2017-04-04 (×4): 1000 mg via ORAL
  Filled 2017-04-03 (×4): qty 2

## 2017-04-03 MED ORDER — PRENATAL MULTIVITAMIN CH
1.0000 | ORAL_TABLET | Freq: Every day | ORAL | Status: DC
  Administered 2017-04-04 – 2017-04-06 (×3): 1 via ORAL
  Filled 2017-04-03 (×3): qty 1

## 2017-04-03 MED ORDER — OXYTOCIN 10 UNIT/ML IJ SOLN
INTRAMUSCULAR | Status: AC
  Filled 2017-04-03: qty 4

## 2017-04-03 MED ORDER — LACTATED RINGERS IV SOLN
INTRAVENOUS | Status: DC
  Administered 2017-04-03 (×3): via INTRAVENOUS

## 2017-04-03 MED ORDER — GUAIFENESIN-DM 100-10 MG/5ML PO SYRP
10.0000 mL | ORAL_SOLUTION | ORAL | Status: DC | PRN
  Administered 2017-04-03 – 2017-04-06 (×12): 10 mL via ORAL
  Filled 2017-04-03 (×14): qty 10

## 2017-04-03 MED ORDER — SCOPOLAMINE 1 MG/3DAYS TD PT72
MEDICATED_PATCH | TRANSDERMAL | Status: DC | PRN
  Administered 2017-04-03: 1 via TRANSDERMAL

## 2017-04-03 MED ORDER — SCOPOLAMINE 1 MG/3DAYS TD PT72
MEDICATED_PATCH | TRANSDERMAL | Status: AC
  Filled 2017-04-03: qty 1

## 2017-04-03 MED ORDER — ONDANSETRON HCL 4 MG/2ML IJ SOLN: 4.0000 mg | Freq: Three times a day (TID) | INTRAMUSCULAR | Status: DC | PRN

## 2017-04-03 MED ORDER — SCOPOLAMINE 1 MG/3DAYS TD PT72: 1.0000 | MEDICATED_PATCH | Freq: Once | TRANSDERMAL | Status: DC

## 2017-04-03 MED ORDER — DIPHENHYDRAMINE HCL 50 MG/ML IJ SOLN: 12.5000 mg | INTRAMUSCULAR | Status: DC | PRN

## 2017-04-03 MED ORDER — MORPHINE SULFATE (PF) 0.5 MG/ML IJ SOLN
INTRAMUSCULAR | Status: DC | PRN
  Administered 2017-04-03: .2 mg via INTRATHECAL

## 2017-04-03 MED ORDER — KETOROLAC TROMETHAMINE 30 MG/ML IJ SOLN: 30.0000 mg | Freq: Once | INTRAMUSCULAR | Status: DC | PRN

## 2017-04-03 MED ORDER — DEXAMETHASONE SODIUM PHOSPHATE 4 MG/ML IJ SOLN
INTRAMUSCULAR | Status: DC | PRN
  Administered 2017-04-03: 4 mg via INTRAVENOUS

## 2017-04-03 MED ORDER — MENTHOL 3 MG MT LOZG
1.0000 | LOZENGE | OROMUCOSAL | Status: DC | PRN
  Administered 2017-04-04: 3 mg via ORAL
  Filled 2017-04-03 (×2): qty 9

## 2017-04-03 MED ORDER — HEPARIN SODIUM (PORCINE) 10000 UNIT/ML IJ SOLN
10000.0000 [IU] | Freq: Two times a day (BID) | INTRAMUSCULAR | Status: DC
  Administered 2017-04-04 – 2017-04-06 (×5): 10000 [IU] via SUBCUTANEOUS
  Filled 2017-04-03 (×9): qty 1

## 2017-04-03 MED ORDER — PHENYLEPHRINE 8 MG IN D5W 100 ML (0.08MG/ML) PREMIX OPTIME
INJECTION | INTRAVENOUS | Status: DC | PRN
  Administered 2017-04-03: 60 ug/min via INTRAVENOUS

## 2017-04-03 MED ORDER — PHENYLEPHRINE 8 MG IN D5W 100 ML (0.08MG/ML) PREMIX OPTIME
INJECTION | INTRAVENOUS | Status: AC
  Filled 2017-04-03: qty 100

## 2017-04-03 MED ORDER — ZOLPIDEM TARTRATE 5 MG PO TABS: 5.0000 mg | ORAL_TABLET | Freq: Every evening | ORAL | Status: DC | PRN

## 2017-04-03 MED ORDER — NALBUPHINE HCL 10 MG/ML IJ SOLN: 5.0000 mg | INTRAMUSCULAR | Status: DC | PRN

## 2017-04-03 MED ORDER — DIBUCAINE 1 % RE OINT: 1.0000 "application " | TOPICAL_OINTMENT | RECTAL | Status: DC | PRN

## 2017-04-03 MED ORDER — COCONUT OIL OIL
1.0000 "application " | TOPICAL_OIL | Status: DC | PRN
  Administered 2017-04-04 – 2017-04-06 (×2): 1 via TOPICAL
  Filled 2017-04-03 (×2): qty 120

## 2017-04-03 MED ORDER — ONDANSETRON HCL 4 MG/2ML IJ SOLN
INTRAMUSCULAR | Status: DC | PRN
  Administered 2017-04-03: 4 mg via INTRAVENOUS

## 2017-04-03 MED ORDER — MEPERIDINE HCL 25 MG/ML IJ SOLN: 6.2500 mg | INTRAMUSCULAR | Status: DC | PRN

## 2017-04-03 MED ORDER — OXYCODONE-ACETAMINOPHEN 5-325 MG PO TABS
1.0000 | ORAL_TABLET | ORAL | Status: DC | PRN
  Administered 2017-04-05 – 2017-04-06 (×2): 1 via ORAL
  Filled 2017-04-03 (×3): qty 1

## 2017-04-03 MED ORDER — SODIUM CHLORIDE 0.9% FLUSH: 3.0000 mL | INTRAVENOUS | Status: DC | PRN

## 2017-04-03 MED ORDER — KETOROLAC TROMETHAMINE 30 MG/ML IJ SOLN
INTRAMUSCULAR | Status: AC
  Administered 2017-04-03: 30 mg via INTRAMUSCULAR
  Filled 2017-04-03: qty 1

## 2017-04-03 MED ORDER — GENTAMICIN SULFATE 40 MG/ML IJ SOLN
INTRAMUSCULAR | Status: AC
  Administered 2017-04-03: 100 mL via INTRAVENOUS
  Filled 2017-04-03: qty 7.75

## 2017-04-03 MED ORDER — SIMETHICONE 80 MG PO CHEW: 80.0000 mg | CHEWABLE_TABLET | ORAL | Status: DC | PRN

## 2017-04-03 MED ORDER — OXYCODONE-ACETAMINOPHEN 5-325 MG PO TABS
2.0000 | ORAL_TABLET | ORAL | Status: DC | PRN
  Administered 2017-04-04 – 2017-04-05 (×4): 2 via ORAL
  Filled 2017-04-03 (×4): qty 2

## 2017-04-03 MED ORDER — NALOXONE HCL 0.4 MG/ML IJ SOLN: 0.4000 mg | INTRAMUSCULAR | Status: DC | PRN

## 2017-04-03 MED ORDER — WITCH HAZEL-GLYCERIN EX PADS: 1.0000 "application " | MEDICATED_PAD | CUTANEOUS | Status: DC | PRN

## 2017-04-03 MED ORDER — HYDROMORPHONE HCL 1 MG/ML IJ SOLN: 0.2500 mg | INTRAMUSCULAR | Status: DC | PRN

## 2017-04-03 MED ORDER — SIMETHICONE 80 MG PO CHEW
80.0000 mg | CHEWABLE_TABLET | ORAL | Status: DC
  Administered 2017-04-04 – 2017-04-05 (×3): 80 mg via ORAL
  Filled 2017-04-03 (×3): qty 1

## 2017-04-03 MED ORDER — OXYTOCIN 40 UNITS IN LACTATED RINGERS INFUSION - SIMPLE MED: 2.5000 [IU]/h | INTRAVENOUS | Status: AC

## 2017-04-03 MED ORDER — DEXTROSE 5 % IV SOLN: 1.0000 ug/kg/h | INTRAVENOUS | Status: DC | PRN

## 2017-04-03 MED ORDER — ONDANSETRON HCL 4 MG/2ML IJ SOLN
INTRAMUSCULAR | Status: AC
  Filled 2017-04-03: qty 2

## 2017-04-03 MED ORDER — IBUPROFEN 600 MG PO TABS
600.0000 mg | ORAL_TABLET | Freq: Four times a day (QID) | ORAL | Status: DC
  Administered 2017-04-04 – 2017-04-06 (×9): 600 mg via ORAL
  Filled 2017-04-03 (×9): qty 1

## 2017-04-03 MED ORDER — DEXAMETHASONE SODIUM PHOSPHATE 10 MG/ML IJ SOLN
INTRAMUSCULAR | Status: AC
  Filled 2017-04-03: qty 1

## 2017-04-03 MED ORDER — MORPHINE SULFATE (PF) 0.5 MG/ML IJ SOLN
INTRAMUSCULAR | Status: AC
  Filled 2017-04-03: qty 10

## 2017-04-03 MED ORDER — DIPHENHYDRAMINE HCL 25 MG PO CAPS: 25.0000 mg | ORAL_CAPSULE | Freq: Four times a day (QID) | ORAL | Status: DC | PRN

## 2017-04-03 MED ORDER — LACTATED RINGERS IV SOLN
INTRAVENOUS | Status: DC | PRN
  Administered 2017-04-03: 14:00:00 via INTRAVENOUS

## 2017-04-03 MED ORDER — DIPHENHYDRAMINE HCL 25 MG PO CAPS
25.0000 mg | ORAL_CAPSULE | ORAL | Status: DC | PRN
  Filled 2017-04-03: qty 1

## 2017-04-03 MED ORDER — ACETAMINOPHEN 325 MG PO TABS
650.0000 mg | ORAL_TABLET | ORAL | Status: DC | PRN
  Administered 2017-04-06: 650 mg via ORAL
  Filled 2017-04-03: qty 2

## 2017-04-03 MED ORDER — OXYTOCIN 10 UNIT/ML IJ SOLN
INTRAVENOUS | Status: DC | PRN
  Administered 2017-04-03: 40 [IU] via INTRAVENOUS

## 2017-04-03 MED ORDER — KETOROLAC TROMETHAMINE 30 MG/ML IJ SOLN
30.0000 mg | Freq: Four times a day (QID) | INTRAMUSCULAR | Status: AC | PRN
  Administered 2017-04-03: 30 mg via INTRAVENOUS
  Administered 2017-04-03: 30 mg via INTRAMUSCULAR
  Filled 2017-04-03: qty 1

## 2017-04-03 MED ORDER — FENTANYL CITRATE (PF) 100 MCG/2ML IJ SOLN
INTRAMUSCULAR | Status: DC | PRN
  Administered 2017-04-03: 30 ug via INTRAVENOUS
  Administered 2017-04-03: 20 ug via INTRATHECAL
  Administered 2017-04-03: 50 ug via INTRAVENOUS

## 2017-04-03 MED ORDER — SIMETHICONE 80 MG PO CHEW
80.0000 mg | CHEWABLE_TABLET | Freq: Three times a day (TID) | ORAL | Status: DC
  Administered 2017-04-03 – 2017-04-06 (×9): 80 mg via ORAL
  Filled 2017-04-03 (×9): qty 1

## 2017-04-03 MED ORDER — SODIUM CHLORIDE 0.9 % IR SOLN
Status: DC | PRN
  Administered 2017-04-03: 400 mL

## 2017-04-03 SURGICAL SUPPLY — 34 items
BENZOIN TINCTURE PRP APPL 2/3 (GAUZE/BANDAGES/DRESSINGS) ×3 IMPLANT
CHLORAPREP W/TINT 26ML (MISCELLANEOUS) ×3 IMPLANT
CLAMP CORD UMBIL (MISCELLANEOUS) IMPLANT
CLOSURE WOUND 1/2 X4 (GAUZE/BANDAGES/DRESSINGS) ×1
CLOTH BEACON ORANGE TIMEOUT ST (SAFETY) ×3 IMPLANT
DERMABOND ADVANCED (GAUZE/BANDAGES/DRESSINGS)
DERMABOND ADVANCED .7 DNX12 (GAUZE/BANDAGES/DRESSINGS) IMPLANT
DRSG OPSITE POSTOP 4X10 (GAUZE/BANDAGES/DRESSINGS) ×3 IMPLANT
ELECT REM PT RETURN 9FT ADLT (ELECTROSURGICAL) ×3
ELECTRODE REM PT RTRN 9FT ADLT (ELECTROSURGICAL) ×1 IMPLANT
EXTRACTOR VACUUM KIWI (MISCELLANEOUS) IMPLANT
GAUZE SPONGE 4X4 12PLY STRL LF (GAUZE/BANDAGES/DRESSINGS) ×6 IMPLANT
GLOVE BIO SURGEON STRL SZ 6 (GLOVE) ×3 IMPLANT
GLOVE BIOGEL PI IND STRL 6 (GLOVE) ×2 IMPLANT
GLOVE BIOGEL PI IND STRL 7.0 (GLOVE) ×1 IMPLANT
GLOVE BIOGEL PI INDICATOR 6 (GLOVE) ×4
GLOVE BIOGEL PI INDICATOR 7.0 (GLOVE) ×2
GOWN STRL REUS W/TWL LRG LVL3 (GOWN DISPOSABLE) ×6 IMPLANT
KIT ABG SYR 3ML LUER SLIP (SYRINGE) ×3 IMPLANT
NEEDLE HYPO 25X5/8 SAFETYGLIDE (NEEDLE) ×3 IMPLANT
NS IRRIG 1000ML POUR BTL (IV SOLUTION) ×3 IMPLANT
PACK C SECTION WH (CUSTOM PROCEDURE TRAY) ×3 IMPLANT
PAD ABD 7.5X8 STRL (GAUZE/BANDAGES/DRESSINGS) ×3 IMPLANT
PAD OB MATERNITY 4.3X12.25 (PERSONAL CARE ITEMS) ×3 IMPLANT
PENCIL SMOKE EVAC W/HOLSTER (ELECTROSURGICAL) ×3 IMPLANT
STRIP CLOSURE SKIN 1/2X4 (GAUZE/BANDAGES/DRESSINGS) ×2 IMPLANT
SUT CHROMIC 0 CTX 36 (SUTURE) ×9 IMPLANT
SUT MON AB 2-0 CT1 27 (SUTURE) ×3 IMPLANT
SUT PDS AB 0 CT1 27 (SUTURE) IMPLANT
SUT PLAIN 0 NONE (SUTURE) IMPLANT
SUT VIC AB 0 CT1 36 (SUTURE) IMPLANT
SUT VIC AB 4-0 KS 27 (SUTURE) IMPLANT
TOWEL OR 17X24 6PK STRL BLUE (TOWEL DISPOSABLE) ×3 IMPLANT
TRAY FOLEY BAG SILVER LF 14FR (SET/KITS/TRAYS/PACK) IMPLANT

## 2017-04-03 NOTE — Addendum Note (Signed)
Addendum  created 04/03/17 1902 by Graciela HusbandsFussell, Devaughn Savant O, CRNA   Sign clinical note

## 2017-04-03 NOTE — Progress Notes (Signed)
No change to H&P. No ultrasound for presentation performed; pt reports she wants C/S regardless of presentation.  Mitchel HonourMegan Merlyn Bollen, DO

## 2017-04-03 NOTE — Anesthesia Postprocedure Evaluation (Addendum)
Anesthesia Post Note  Patient: Krystal Benitez  Procedure(s) Performed: CESAREAN SECTION (N/A Abdomen)     Patient location during evaluation: Mother Baby Anesthesia Type: Spinal Level of consciousness: awake and alert and oriented Pain management: satisfactory to patient Vital Signs Assessment: post-procedure vital signs reviewed and stable Respiratory status: respiratory function stable Cardiovascular status: stable Postop Assessment: no headache, no backache, epidural receding, patient able to bend at knees, no signs of nausea or vomiting and adequate PO intake Anesthetic complications: no    Last Vitals:  Vitals:   04/03/17 1615 04/03/17 1715  BP: 110/75 116/81  Pulse: 88 89  Resp: 17 16  Temp: 36.4 C 37.3 C  SpO2: 98% 98%    Last Pain:  Vitals:   04/03/17 1818  TempSrc:   PainSc: 2    Pain Goal: Patients Stated Pain Goal: 4 (04/03/17 1241)               [name]

## 2017-04-03 NOTE — Anesthesia Postprocedure Evaluation (Signed)
Anesthesia Post Note  Patient: Krystal Benitez  Procedure(s) Performed: CESAREAN SECTION (N/A Abdomen)     Patient location during evaluation: PACU Anesthesia Type: Spinal Level of consciousness: awake Pain management: pain level controlled Vital Signs Assessment: post-procedure vital signs reviewed and stable Respiratory status: spontaneous breathing Cardiovascular status: stable Postop Assessment: no headache, no backache, spinal receding, no apparent nausea or vomiting and patient able to bend at knees Anesthetic complications: no    Last Vitals:  Vitals:   04/03/17 1532 04/03/17 1533  BP:    Pulse: (!) 101 (!) 107  Resp: 18 20  Temp:    SpO2: 100% 100%    Last Pain:  Vitals:   04/03/17 1500  TempSrc:   PainSc: 0-No pain   Pain Goal: Patients Stated Pain Goal: 4 (04/03/17 1241)               Mykael Trott JR,JOHN Susann GivensFRANKLIN

## 2017-04-03 NOTE — Anesthesia Preprocedure Evaluation (Signed)
Anesthesia Evaluation  Patient identified by MRN, date of birth, ID band Patient awake    Reviewed: Allergy & Precautions, H&P , NPO status , Patient's Chart, lab work & pertinent test results  Airway Mallampati: II  TM Distance: >3 FB Neck ROM: full    Dental no notable dental hx. (+) Teeth Intact   Pulmonary neg pulmonary ROS,    Pulmonary exam normal breath sounds clear to auscultation       Cardiovascular negative cardio ROS Normal cardiovascular exam Rhythm:regular Rate:Normal     Neuro/Psych negative neurological ROS  negative psych ROS   GI/Hepatic negative GI ROS, Neg liver ROS,   Endo/Other  negative endocrine ROS  Renal/GU negative Renal ROS  negative genitourinary   Musculoskeletal negative musculoskeletal ROS (+)   Abdominal (+) + obese,   Peds  Hematology   Anesthesia Other Findings   Reproductive/Obstetrics (+) Pregnancy                             Anesthesia Physical Anesthesia Plan  ASA: II  Anesthesia Plan: Spinal   Post-op Pain Management:    Induction:   PONV Risk Score and Plan: 3 and Ondansetron, Dexamethasone and Scopolamine patch - Pre-op  Airway Management Planned:   Additional Equipment:   Intra-op Plan:   Post-operative Plan:   Informed Consent: I have reviewed the patients History and Physical, chart, labs and discussed the procedure including the risks, benefits and alternatives for the proposed anesthesia with the patient or authorized representative who has indicated his/her understanding and acceptance.     Plan Discussed with: CRNA and Surgeon  Anesthesia Plan Comments:         Anesthesia Quick Evaluation

## 2017-04-03 NOTE — Op Note (Signed)
Krystal Benitez PROCEDURE DATE: 04/03/2017  PREOPERATIVE DIAGNOSIS: Intrauterine pregnancy at  559w0d weeks gestation, breech presentation  POSTOPERATIVE DIAGNOSIS: The same  PROCEDURE: Primary Low Transverse Cesarean Section  SURGEON:  Dr. Mitchel HonourMegan Danarius Mcconathy  INDICATIONS: Krystal Benitez is a 27 y.o. G2P0010 at 4359w0d scheduled for cesarean section secondary to breech presentation; declined ECV.  The risks of cesarean section discussed with the patient included but were not limited to: bleeding which may require transfusion or reoperation; infection which may require antibiotics; injury to bowel, bladder, ureters or other surrounding organs; injury to the fetus; need for additional procedures including hysterectomy in the event of a life-threatening hemorrhage; placental abnormalities wth subsequent pregnancies, incisional problems, thromboembolic phenomenon and other postoperative/anesthesia complications. The patient concurred with the proposed plan, giving informed written consent for the procedure.    FINDINGS:  Viable female infant in breech presentation, APGARs 8,9: weight pending  clear amniotic fluid.  Intact placenta, three vessel cord.  Grossly normal uterus, ovaries and fallopian tubes. .   ANESTHESIA:  Spinal ESTIMATED BLOOD LOSS: 718 mL ml SPECIMENS: Placenta sent to L&D COMPLICATIONS: None immediate  PROCEDURE IN DETAIL:  The patient received intravenous antibiotics and had sequential compression devices applied to her lower extremities while in the preoperative area.  She was then taken to the operating room where spinal anesthesia was administered and was found to be adequate. She was then placed in a dorsal supine position with a leftward tilt, and prepped and draped in a sterile manner.  A foley catheter was placed into her bladder and attached to constant gravity.  After an adequate timeout was performed, a Pfannenstiel skin incision was made with scalpel and carried through to the  underlying layer of fascia. The fascia was incised in the midline and this incision was extended bilaterally using the Mayo scissors. Kocher clamps were applied to the superior aspect of the fascial incision and the underlying rectus muscles were dissected off bluntly. A similar process was carried out on the inferior aspect of the facial incision. The rectus muscles were separated in the midline bluntly and the peritoneum was entered bluntly. Bladder flap was created sharply and developed bluntly.  Bladder blade was placed.  A transverse hysterotomy was made with a scalpel and extended bilaterally bluntly. The bladder blade was then removed. The infant was successfully delivered using standard breech maneuvers, and cord was clamped and cut and infant was handed over to awaiting neonatology team. Uterine massage was then administered and the placenta delivered intact with three-vessel cord. The uterus was cleared of clot and debris.  The hysterotomy was closed with 0 chromic.  A second imbricating suture of 0-chromic was used to reinforce the incision and aid in hemostasis.  The peritoneum and rectus muscles were noted to be hemostatic and were reapproximated using 3-0 monocryl in a running fashion.  The fascia was closed with 0-Vicryl in a running fashion with good restoration of anatomy.  The subcutaneus tissue was copiously irrigated.  The skin was closed with 4-0 vicryl in a subcuticular fashion.  Pt tolerated the procedure will.  All counts were correct x2.  Pt went to the recovery room in stable condition.

## 2017-04-03 NOTE — Lactation Note (Signed)
This note was copied from a baby's chart. Lactation Consultation Note  Patient Name: Krystal Benitez ZOXWR'UToday's Date: 04/03/2017 Reason for consult: Initial assessment;Primapara;1st time breastfeeding;Term;Other (Comment)(DAT (+))  5 hour old 1339w 0d female who is being exclusively BF by his mother. Baby was born in a breech position and he had dislocated his hip; but it didn't interfere with BF. Baby was put STS before latching and after a few tries, he got a latch on the football position. Mom is aware of DAT (+) and was encourage to hand express and spoon feed after each BF session. Also, encourage mom to put baby STS if he's not cueing, and to feed on cues instead of on schedule. BF brochure, feeding diary and BF support group were discussed, mom is aware of LC services and will call if needed.  Maternal Data Formula Feeding for Exclusion: No Has patient been taught Hand Expression?: Yes Does the patient have breastfeeding experience prior to this delivery?: No  Feeding Feeding Type: Breast Fed Length of feed: 10 min(infant still feeding when exiting the room)  LATCH Score Latch: Repeated attempts needed to sustain latch, nipple held in mouth throughout feeding, stimulation needed to elicit sucking reflex.  Audible Swallowing: A few with stimulation  Type of Nipple: Everted at rest and after stimulation  Comfort (Breast/Nipple): Soft / non-tender  Hold (Positioning): Assistance needed to correctly position infant at breast and maintain latch.  LATCH Score: 7  Interventions Interventions: Breast feeding basics reviewed;Assisted with latch;Skin to skin;Breast massage;Hand express;Breast compression;Adjust position;Support pillows;Position options;Expressed milk  Lactation Tools Discussed/Used WIC Program: No   Consult Status Consult Status: Follow-up Date: 04/04/17 Follow-up type: In-patient    Sabien Umland Venetia ConstableS Akane Tessier 04/03/2017, 7:25 PM

## 2017-04-03 NOTE — Transfer of Care (Signed)
Immediate Anesthesia Transfer of Care Note  Patient: Krystal Benitez  Procedure(s) Performed: CESAREAN SECTION (N/A Abdomen)  Patient Location: PACU  Anesthesia Type:Spinal  Level of Consciousness: awake, alert , oriented and patient cooperative  Airway & Oxygen Therapy: Patient Spontanous Breathing  Post-op Assessment: Report given to RN and Post -op Vital signs reviewed and stable  Post vital signs: Reviewed and stable  Last Vitals:  Vitals:   04/03/17 1241  BP: 118/78  Pulse: (!) 113  Resp: 18  Temp: 36.9 C    Last Pain:  Vitals:   04/03/17 1241  TempSrc: Oral  PainSc: 0-No pain      Patients Stated Pain Goal: 4 (04/03/17 1241)  Complications: No apparent anesthesia complications

## 2017-04-03 NOTE — Anesthesia Procedure Notes (Signed)
Spinal  Patient location during procedure: OR Start time: 04/03/2017 1:49 PM End time: 04/03/2017 1:53 PM Staffing Anesthesiologist: Leilani AbleHatchett, Chezney Huether, MD Performed: anesthesiologist  Preanesthetic Checklist Completed: patient identified, site marked, surgical consent, pre-op evaluation, timeout performed, IV checked, risks and benefits discussed and monitors and equipment checked Spinal Block Patient position: sitting Prep: site prepped and draped and DuraPrep Patient monitoring: continuous pulse ox and blood pressure Approach: midline Location: L3-4 Injection technique: single-shot Needle Needle type: Pencan  Needle gauge: 24 G Needle length: 10 cm Needle insertion depth: 5 cm Assessment Sensory level: T4

## 2017-04-04 ENCOUNTER — Other Ambulatory Visit: Payer: Self-pay

## 2017-04-04 ENCOUNTER — Encounter (HOSPITAL_COMMUNITY): Payer: Self-pay | Admitting: Obstetrics & Gynecology

## 2017-04-04 LAB — CBC
HEMATOCRIT: 27.8 % — AB (ref 36.0–46.0)
HEMOGLOBIN: 9.7 g/dL — AB (ref 12.0–15.0)
MCH: 29.3 pg (ref 26.0–34.0)
MCHC: 34.9 g/dL (ref 30.0–36.0)
MCV: 84 fL (ref 78.0–100.0)
Platelets: 227 10*3/uL (ref 150–400)
RBC: 3.31 MIL/uL — ABNORMAL LOW (ref 3.87–5.11)
RDW: 15.3 % (ref 11.5–15.5)
WBC: 16.8 10*3/uL — ABNORMAL HIGH (ref 4.0–10.5)

## 2017-04-04 MED ORDER — AMOXICILLIN-POT CLAVULANATE 500-125 MG PO TABS
1.0000 | ORAL_TABLET | Freq: Two times a day (BID) | ORAL | Status: DC
  Administered 2017-04-04 – 2017-04-06 (×4): 500 mg via ORAL
  Filled 2017-04-04 (×6): qty 1

## 2017-04-04 MED ORDER — OXYCODONE HCL 5 MG PO TABS
5.0000 mg | ORAL_TABLET | Freq: Four times a day (QID) | ORAL | Status: DC | PRN
  Administered 2017-04-04: 5 mg via ORAL
  Filled 2017-04-04: qty 1

## 2017-04-04 NOTE — Progress Notes (Signed)
Subjective: Postpartum Day 1: Cesarean Delivery Patient reports tolerating PO.    Objective: Vital signs in last 24 hours: Temp:  [97.6 F (36.4 C)-99.2 F (37.3 C)] 99.1 F (37.3 C) (01/19 0600) Pulse Rate:  [78-113] 80 (01/19 0600) Resp:  [11-32] 18 (01/19 0600) BP: (86-119)/(57-83) 87/62 (01/19 0600) SpO2:  [96 %-100 %] 98 % (01/19 0600) Weight:  [177 lb (80.3 kg)] 177 lb (80.3 kg) (01/18 1241)  Physical Exam:  General: alert, cooperative and no distress Lochia: appropriate Uterine Fundus: firm Incision: healing well DVT Evaluation: No evidence of DVT seen on physical exam.  Recent Labs    04/03/17 1711 04/04/17 0533  HGB 11.3* 9.7*  HCT 32.7* 27.8*    Assessment/Plan: Status post Cesarean section. Doing well postoperatively.  Continue current care D/W circumcision and risks.  Roselle LocusJames E Rosamond Andress II 04/04/2017, 8:13 AM

## 2017-04-05 NOTE — Lactation Note (Signed)
This note was copied from a baby's chart. Lactation Consultation Note  Patient Name: Krystal Benitez ZOXWR'UToday's Date: 04/05/2017 Reason for consult: Follow-up assessment;Infant weight loss  8% weight loss, at 33 hours Bili check 3.4  LC reviewed doc flow sheets with mom and grandmother.  LC feels the weight loss correlates with the voids and stools ( 11 wets, and 5 stools )  LC updated the doc flow sheets, per mom.  LC suspects the reason mom has gotten sore nipples is the depth has not been achieved  At the breast consistently.  The RN had mentioned the baby may have a tongue tie- LC assessed and noted some limited  Tongue mobility, and baby does not extend tongue over gum line, small notch midline noted.  LC had baby suck on the gloved finger of the LC and noted the baby to relax tongue.  For the latch the baby is able to obtain depth with assistance and sustain latch for 12 mins 1 st  Latch, and 2nd latch was still feeding, excellent transfer of milk. Comfort achieved both breast.  Dad present for 2nd latch, and LC showed dad how he could assist mom to obtain depth.  LC recommended to mom prior to latch  Breast massage , hand express , latch STS with firm support.  And not to allow the baby to nibble his way onto the breast/ wait for wide open mouth and then latch  With breast compressions and comfort.   LC instructed mom on the use comfort gels for after feedings  Shells when not wearing comfort gels except when sleeping.   Post pump ( #24 F )  both breast when baby isn't cluster feeding , and supplement back with spoon feeding.   RN will need to show mom spoon feeding/ EBM not available.    Maternal Data Has patient been taught Hand Expression?: Yes  Feeding Feeding Type: Breast Fed Length of feed: (left breast / football/ multiple swallows and still feeding )  LATCH Score Latch: Grasps breast easily, tongue down, lips flanged, rhythmical sucking.  Audible Swallowing:  Spontaneous and intermittent  Type of Nipple: Everted at rest and after stimulation  Comfort (Breast/Nipple): Filling, red/small blisters or bruises, mild/mod discomfort(left nipple has small abrasion on top of the nipple )  Hold (Positioning): Assistance needed to correctly position infant at breast and maintain latch.  LATCH Score: 8  Interventions Interventions: Breast feeding basics reviewed;Assisted with latch;Skin to skin;Breast massage;Hand express;Support pillows;Adjust position;Position options  Lactation Tools Discussed/Used Tools: Shells;Comfort gels Shell Type: Inverted Breast pump type: Double-Electric Breast Pump   Consult Status Consult Status: Follow-up Date: 04/06/17 Follow-up type: In-patient    [name] 04/05/2017, 6:17 PM

## 2017-04-05 NOTE — Care Management Note (Signed)
LCSW consulted for Edinburgh scale.  LCSW met with MOB and maternal grandmother to assess for services.  MOB reported this was her first baby and she was frustrated with the feedings stating she was waiting for lactation all day.  MOB reported that she lived with her husband.  MOB reported that they had all equipment necessary to take newborn home.  MOB reported that she would be off from work for 8 weeks and her husband would be off for one week.  MOB reported that maternal grandmother and paternal grandmother would be taking turns supporting MOB and newborn at home. LCSW educated MOB about post partem and provided a checklist for her to keep track of her symptoms.  MOB was asked to follow up with MD should she have any symptoms or concerns.  RN reported no concerns. No referrals or barriers to discharge.

## 2017-04-05 NOTE — Progress Notes (Signed)
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

## 2017-04-06 ENCOUNTER — Ambulatory Visit: Payer: Self-pay

## 2017-04-06 LAB — DIC (DISSEMINATED INTRAVASCULAR COAGULATION) PANEL
APTT: 47 s — AB (ref 24–36)
INR: 0.95
PROTHROMBIN TIME: 12.6 s (ref 11.4–15.2)

## 2017-04-06 LAB — DIC (DISSEMINATED INTRAVASCULAR COAGULATION)PANEL
D-Dimer, Quant: 0.6 ug/mL-FEU — ABNORMAL HIGH (ref 0.00–0.50)
Fibrinogen: 765 mg/dL — ABNORMAL HIGH (ref 210–475)
Platelets: 303 10*3/uL (ref 150–400)
Smear Review: NONE SEEN

## 2017-04-06 MED ORDER — AMOXICILLIN-POT CLAVULANATE 875-125 MG PO TABS: 1.0000 | ORAL_TABLET | Freq: Two times a day (BID) | ORAL | 3 refills | Status: DC

## 2017-04-06 MED ORDER — OXYCODONE HCL 5 MG PO TABS: 5.0000 mg | ORAL_TABLET | ORAL | 0 refills | Status: DC | PRN

## 2017-04-06 MED ORDER — DOCUSATE SODIUM 100 MG PO CAPS: 100.0000 mg | ORAL_CAPSULE | Freq: Two times a day (BID) | ORAL | 2 refills | Status: DC

## 2017-04-06 NOTE — Lactation Note (Signed)
This note was copied from a baby's chart. Lactation Consultation Note  Patient Name: Krystal Benitez Reason for consult: Follow-up assessment;Infant weight loss;Primapara;1st time breastfeeding  Baby is 5280 hours old , 2nd LC visit for today.  Baby is at 10 % weight loss LC reviewed and updated the doc flow sheets per mom and grandmother  As LC entered the room baby resting next to mom sucking on a pacifier.  LC discussed the challenges a pacifier can have on the early stages of breast feeding , especially  Covering up feeding cues, narrow sucking pattern.  LC offered to re-latch and add the 79F SNS and latch mom receptive  Mom needed assistance to latch ( minimal ) depth achieved with help and the LC added the 79F SNS  And baby took 12 ml of formula.  Baby fed for 12 mins , and took 12 ml of formula, and at the end of feeding dell asleep.  Nipple well rounded when mom released suction , and nipple well rounded.  LC assisted to set up the DEBP and mom was starting to post pump.  LC reassured that the baby latched well, and the supplementing is needed at this point to increase  Weight and to protect the baby's energy level so the baby will be more efficient with latching.  LC reviewed hand expressing, steady flow and had mom repeat technique and she did well.  Mom seems more confident tonight compared to yesterday.  [facility] short frenulum. LC noted but it did not interfere with latch  Or 79F SNS.       Maternal Data Has patient been taught Hand Expression?: Yes  Feeding - Breast fed on the right breast for 12 mins  Feeding Type: Formula Length of feed: 12 min  LATCH Score Latch: Grasps breast easily, tongue down, lips flanged, rhythmical sucking.  Audible Swallowing: Spontaneous and intermittent  Type of Nipple: Everted at rest and after stimulation  Comfort (Breast/Nipple): Filling, red/small blisters or bruises, mild/mod  discomfort  Hold (Positioning): Assistance needed to correctly position infant at breast and maintain latch.  LATCH Score: 8  Interventions Interventions: Breast feeding basics reviewed;Assisted with latch;Skin to skin;Breast massage;Hand express;Pre-pump if needed;Breast compression;Adjust position;Support pillows;Position options  Lactation Tools Discussed/Used Tools: Shells;Coconut oil;Comfort gels;79F feeding tube / Syringe Shell Type: Inverted Breast pump type: Double-Electric Breast Pump Pump Review: (plans to post pump after feeding )   Consult Status Consult Status: Follow-up Date: 04/07/17 Follow-up type: In-patient    Krystal Benitez Benitez, 10:55 PM

## 2017-04-06 NOTE — Discharge Summary (Addendum)
Obstetric Discharge Summary Reason for Admission: cesarean section and breech Prenatal Procedures: none Intrapartum Procedures: cesarean: low cervical, transverse Postpartum Procedures: none Complications-Operative and Postpartum: none Hemoglobin  Date Value Ref Range Status  04/04/2017 9.7 (L) 12.0 - 15.0 g/dL Final   HCT  Date Value Ref Range Status  04/04/2017 27.8 (L) 36.0 - 46.0 % Final    Physical Exam:  General: alert, cooperative and appears stated age 75Lochia: appropriate Uterine Fundus: firm Incision: healing well, no significant drainage, no dehiscence, no significant erythema DVT Evaluation: No evidence of DVT seen on physical exam. Negative Homan's sign. No cords or calf tenderness. No significant calf/ankle edema.  Discharge Diagnoses: Term Pregnancy-delivered  Discharge Information: Date: 04/06/2017 Activity: pelvic rest Diet: routine Medications: Colace and oxycodone and heparin Condition: stable Instructions: refer to practice specific booklet  Patient to continue proph UFH BID x 6 weeks per primary OBGYN's plan. Per patient report, has enough medication and does not need refill at this time. Platelets were WNL. APTT was 47 but that was taken one hour after UFH given thus not an accurate reading. Per heme, no need for routine monitoring and pt can come home on same dose.  Discharge to: home   Newborn Data: Live born female  Birth Weight: 6 lb 14.8 oz (3140 g) APGAR: 8, 9  Newborn Delivery   Birth date/time:  04/03/2017 14:13:00 Delivery type:  C-Section, Low Transverse C-section categorization:  Primary     Home with mother.  Krystal Benitez 04/06/2017, 8:31 AM

## 2017-04-06 NOTE — Lactation Note (Signed)
This note was copied from a baby's chart. Lactation Consultation Note  Patient Name: Krystal Marlowe Altaylor Murren OZHYQ'MToday's Date: 04/06/2017 Reason for consult: Follow-up assessment Mom called out for assist.  LC took 5 french feeding tube and syringe to room to instruct how to use at breast.  Mom states she would like to rest her nipples and bottle feed the 7 mls of transitional milk she just pumped.  Assisted mom and instructed to call when she is ready to put the baby back to breast.  Maternal Data    Feeding Feeding Type: Bottle Fed - Breast Milk Nipple Type: Slow - flow Length of feed: 10 min  LATCH Score Latch: Grasps breast easily, tongue down, lips flanged, rhythmical sucking.  Audible Swallowing: A few with stimulation  Type of Nipple: Everted at rest and after stimulation  Comfort (Breast/Nipple): Filling, red/small blisters or bruises, mild/mod discomfort  Hold (Positioning): Assistance needed to correctly position infant at breast and maintain latch.  LATCH Score: 7  Interventions Interventions: Breast feeding basics reviewed;Assisted with latch;Breast compression;Adjust position;Skin to skin;Breast massage;Support pillows;Hand express;Position options  Lactation Tools Discussed/Used Tools: Comfort gels   Consult Status Consult Status: Follow-up Date: 04/07/17 Follow-up type: In-patient    Huston Benitez, Krystal Nickolas S 04/06/2017, 12:51 PM

## 2017-04-06 NOTE — Lactation Note (Signed)
This note was copied from a baby's chart. Lactation Consultation Note  Patient Name: Krystal Benitez ZOXWR'UToday's Date: 04/06/2017 Reason for consult: Follow-up assessment;Infant weight loss Mom states she just gave baby 10 mls of formula but he won't settle down.  Mom would like to latch baby.  Baby latched easily to right breast.  Active sucks with few swallows noted.  Maternal Data    Feeding Feeding Type: Breast Fed Nipple Type: Slow - flow Length of feed: 10 min  LATCH Score Latch: Grasps breast easily, tongue down, lips flanged, rhythmical sucking.  Audible Swallowing: A few with stimulation  Type of Nipple: Everted at rest and after stimulation  Comfort (Breast/Nipple): Filling, red/small blisters or bruises, mild/mod discomfort  Hold (Positioning): Assistance needed to correctly position infant at breast and maintain latch.  LATCH Score: 7  Interventions Interventions: Breast feeding basics reviewed;Assisted with latch;Breast compression;Adjust position;Skin to skin;Breast massage;Support pillows;Hand express;Position options  Lactation Tools Discussed/Used Tools: Comfort gels   Consult Status Consult Status: Follow-up Date: 04/07/17 Follow-up type: In-patient    Huston FoleyMOULDEN, Jere Vanburen S 04/06/2017, 1:47 PM

## 2017-04-06 NOTE — Lactation Note (Signed)
This note was copied from a baby's chart. Lactation Consultation Note  Patient Name: Krystal Benitez WUJWJ'XToday's Date: 04/06/2017 Reason for consult: Nipple pain/trauma Mom teary and emotional this morning.  C/o pain with latch.  Both nipples red with small cracks at base on left.  Mom using comfort gels.  Baby at a 10% weight loss.  Assisted mom with postioning baby in football hold on right.  Baby latches easily and well but bottom lip needed untucked.  Mom felt initial latch on pain which improved as feeding progressed.  Very few swallows noted.  Baby fell asleep after 10 minutes and I assisted mom with pumping.  She hasn't pumped since yesterday.  Parents would like baby to have additional formula due to weight loss.  Taught FOB paced feeding.  Mom will call out with next feeding and will try SNS.  Mom feeling like she would like another day to have lactation support.  Maternal Data    Feeding Feeding Type: Breast Fed Length of feed: 10 min  LATCH Score Latch: Grasps breast easily, tongue down, lips flanged, rhythmical sucking.  Audible Swallowing: A few with stimulation  Type of Nipple: Everted at rest and after stimulation  Comfort (Breast/Nipple): Filling, red/small blisters or bruises, mild/mod discomfort  Hold (Positioning): Assistance needed to correctly position infant at breast and maintain latch.  LATCH Score: 7  Interventions Interventions: Breast feeding basics reviewed;Assisted with latch;Breast compression;Adjust position;Skin to skin;Breast massage;Support pillows;Hand express;Position options  Lactation Tools Discussed/Used Tools: Comfort gels   Consult Status Consult Status: Follow-up Date: 04/07/17 Follow-up type: In-patient    Huston FoleyMOULDEN, Krystal Graeff S 04/06/2017, 10:24 AM

## 2017-04-07 ENCOUNTER — Ambulatory Visit: Payer: Self-pay

## 2017-04-07 NOTE — Lactation Note (Signed)
This note was copied from a baby's chart. Lactation Consultation Note  Patient Name: Krystal Benitez ZOXWR'UToday's Date: 04/07/2017 Reason for consult: Follow-up assessment;Infant weight loss;Nipple pain/trauma Baby gaining.  Mom is both putting baby to breast with 5 french feeding tube or pumping and bottle feeding.  She is obtaining 20 mls of transitional milk every 2-3 hours.  Parents supplement with formula if baby is still acting hungry.  Nipple soreness is improving.  Parents voice confidence with plan.  Outpatient lactation services and support reviewed and encouraged prn.  Maternal Data    Feeding Feeding Type: Breast Fed Length of feed: 10 min  LATCH Score                   Interventions    Lactation Tools Discussed/Used     Consult Status Consult Status: Complete    Thierry Dobosz S 04/07/2017, 10:15 AM

## 2017-04-26 ENCOUNTER — Inpatient Hospital Stay (HOSPITAL_COMMUNITY)
Admission: AD | Admit: 2017-04-26 | Discharge: 2017-04-26 | Disposition: A | Discharge status: Home / Self Care | Payer: BLUE CROSS/BLUE SHIELD | Source: Ambulatory Visit | Attending: Obstetrics and Gynecology | Admitting: Obstetrics and Gynecology

## 2017-04-26 DIAGNOSIS — Z4800 Encounter for change or removal of nonsurgical wound dressing: Secondary | ICD-10-CM | POA: Insufficient documentation

## 2017-04-26 DIAGNOSIS — X58XXXA Exposure to other specified factors, initial encounter: Secondary | ICD-10-CM | POA: Diagnosis not present

## 2017-04-26 DIAGNOSIS — S31109A Unspecified open wound of abdominal wall, unspecified quadrant without penetration into peritoneal cavity, initial encounter: Secondary | ICD-10-CM | POA: Insufficient documentation

## 2017-04-26 DIAGNOSIS — T8131XA Disruption of external operation (surgical) wound, not elsewhere classified, initial encounter: Secondary | ICD-10-CM

## 2017-04-26 NOTE — MAU Note (Signed)
Patient had C/S 3 weeks ago, small open area started bleeding last night.

## 2017-04-26 NOTE — MAU Provider Note (Signed)
History   S/P c/s of 04/04/17 in with c/o left edge of incision bleeding. This started last night. Denies any other complaints.  CSN: [id number]  Arrival date & time 04/26/17  1505   None     Chief Complaint  Patient presents with  . Wound Check    HPI  Past Medical History:  Diagnosis Date  . Anemia   . Dysrhythmia    tachycardia  . History of anemia as a child   . Hx of blood clots   . Retinal vein occlusion   . Vaginal Pap smear, abnormal     Past Surgical History:  Procedure Laterality Date  . CESAREAN SECTION N/A 04/03/2017   Procedure: CESAREAN SECTION;  Surgeon: Mitchel Honour, DO;  Location: [facility];  Service: Obstetrics;  Laterality: N/A;  Primary edc 04/10/17 allergic to zpack and ancef Heather K RNFA  . DILATION AND CURETTAGE OF UTERUS  2013  . MR LOWER LEG RIGHT ([facility] HX)     muscular surgery    Family History  Problem Relation Age of Onset  . Heart disease Father   . Hypertension Father   . Clotting disorder Father        MULTIPLE BLOOD CLOTS. ON HEPARIN , ASA  . COPD Maternal Grandfather   . Rheum arthritis Paternal Grandmother   . Pancreatic cancer Paternal Grandmother     Social History   Tobacco Use  . Smoking status: Never Smoker  . Smokeless tobacco: Never Used  Substance Use Topics  . Alcohol use: No  . Drug use: No    OB History    Gravida Para Term Preterm AB Living   2 1 1  0 1 1   SAB TAB Ectopic Multiple Live Births   1 0 0 0 1      Review of Systems  Constitutional: Negative.   HENT: Negative.   Eyes: Negative.   Respiratory: Negative.   Cardiovascular: Negative.   Gastrointestinal: Negative.   Endocrine: Negative.   Genitourinary: Negative.   Musculoskeletal: Negative.   Skin:       Left corner of c/s incision bleeding  Allergic/Immunologic: Negative.   Neurological: Negative.   Hematological: Negative.   Psychiatric/Behavioral: Negative.     Allergies  Azithromycin and Cefazolin  Home Medications     BP 101/64   Pulse 91   Temp 98.7 F (37.1 C) (Oral)   Resp 18   Physical Exam  Constitutional: She is oriented to person, place, and time. She appears well-developed and well-nourished.  HENT:  Head: Normocephalic.  Eyes: Pupils are equal, round, and reactive to light.  Neck: Normal range of motion.  Cardiovascular: Normal rate, regular rhythm, normal heart sounds and intact distal pulses.  Pulmonary/Chest: Effort normal and breath sounds normal.  Abdominal: Soft. Bowel sounds are normal.  .25 cm area on left side incision bleeding scant amt at intervals.  Musculoskeletal: Normal range of motion.  Neurological: She is alert and oriented to person, place, and time. She has normal reflexes.  Skin: Skin is warm and dry.  Psychiatric: She has a normal mood and affect. Her behavior is normal. Judgment and thought content normal.    MAU Course  Procedures (including critical care time)  Labs Reviewed - No data to display No results found.   1. Open abdominal incision with drainage, initial encounter       MDM  VSS, heart RRR, .25 cm at edge of left incision oozing scat amt of blood. No redness,swelling noted.  Steri strip applied and POC discussed with Dr. Rana SnareLowe. To d/c ot home

## 2017-04-26 NOTE — Discharge Instructions (Signed)
`Negative Pressure Wound Therapy Dressing Care °Negative pressure wound therapy (NPWT) is a device that helps wounds heal. NPWT helps the wound stay clean and healthy while it heals from the inside. °NPWT uses a bandage (dressing) that is made of a sponge or gauze-like material. The dressing is placed in or inside the wound. The wound is then covered and sealed with a cover dressing that sticks to your skin (adhesive). This keeps air out. A tube connects the cover dressing to a small pump. The pump sucks fluid and germs from the wound. The pump also controls any odor coming from the wound. °What are the risks? °NPWT is usually safe to use. The most common problem is skin irritation from the dressing adhesive, but there are many ways to prevent this from happening. However, more serious problems can develop, such as: °· Bleeding. °· Infection. °· Dehydration. °· Pain. ° °How to change your dressing °How often you change your dressing depends on your wound. If the pump is off for more than two hours, the dressing will need to be changed. Follow your health care provider’s instructions on how often to change it. Your health care provider may change your dressing, or a family member, friend, or caregiver may be shown how to change the dressing. It is important to: °· Wear gloves and protective clothing while changing a dressing. This may include eye protection. °· Never let anyone change your dressing if he or she has an infection, skin condition, or skin wound or cut of any size. ° °Preparing to change your dressing °· If needed, take pain medicine 30 minutes before the dressing change as prescribed by your health care provider. °· Set up a clean station for wound care. You will need: °? A disposable garbage bag that is open and ready to use. °? Hand sanitizer. °? Wound cleanser or saltwater solution (saline) as told by your health care provider. °? New dressing material or bandages. Make sure to open the dressing  package so that the dressing remains on the inside of the package. °You may also need the following in your clean station: °· A box of vinyl gloves. °· Tape. °· Skin protectant. This may be a wipe, film, or spray. °· Clean or germ-free (sterile) scissors. °· Wound liner. °· Cotton tip applicators. ° °Removing your old dressing °· Wash your hands with soap and water. Dry your hands with a clean towel. If soap and water are not available, use hand sanitizer. °· Put on gloves. °· Turn off the pump and disconnect the tubing from the dressing. °· Carefully remove the adhesive cover dressing in the direction of your hair growth. Only touch the outside edges of the dressing. °· Remove the dressing that is inside the wound. If the dressing sticks, use a wound cleanser or saline solution to wet the dressing. This helps it come off more easily. °· Throw the old dressing supplies into the ready garbage bag. °· Remove your gloves by grabbing the cuff and turning the glove inside out. Place the gloves in the trash immediately. °· Wash your hands with soap and water. Dry your hands with a clean towel. If soap and water are not available, use hand sanitizer. °Cleaning your wound °· Follow your health care provider's instructions on how to clean your wound. This may include using a saline or recommended wound cleanser. °· Do not use over-the-counter medicated or antiseptic creams, sprays, liquids, or dressings unless told to do so by your health care   provider. °· Clean the area thoroughly with the recommended saline solution or wound cleanser and a clean gauze pad. °· Throw the gauze pad into the garbage bag. °· Wash your hands with soap and water. Dry your hands with a clean towel. If soap and water are not available, use hand sanitizer. °Applying the dressing °· Apply a skin protectant to any skin that will be exposed to adhesive. Let the skin protectant dry. °· Put a new dressing into the wound. °· Apply a new cover dressing and  tube. °· Take off your gloves. Put them in the plastic bag with the old dressing. Tie the bag shut and throw it away. °· Wash your hands with soap and water. Dry your hands with a clean towel. If soap and water are not available, use hand sanitizer. °· Attach the suction and turn the pump back on. Do not change the settings on the machine without talking to a health care provider. °· Replace the container in the pump that collects fluid if it is full. Do this at least once a week. °Contact a health care provider if: °· You have new pain. °· You develop irritation, a rash, or itching around the wound or dressing. °· You see new black or yellow tissue in your wound. °· The dressing changes are painful or cause bleeding. °· The pump has been off for more than two hours and you do not know how to change the dressing. °· The alarm for the pump goes off and you do not know what to do. °Get help right away if: °· You have a lot of bleeding. °· You see a sudden change in the color or texture of the drainage. °· The wound breaks open. °· You have severe pain. °· You have signs of infection, such as: °? More redness, swelling, or pain. °? More fluid or blood. °? Warmth. °? Pus or a bad smell. °? Red streaks leading from wound. °? A fever. °This information is not intended to replace advice given to you by your health care provider. Make sure you discuss any questions you have with your health care provider. °Document Released: 05/26/2011 Document Revised: 03/29/2015 Document Reviewed: 12/07/2014 °Elsevier Interactive Patient Education © 2018 [employer] ° °

## 2018-03-15 ENCOUNTER — Ambulatory Visit: Payer: BLUE CROSS/BLUE SHIELD | Admitting: Physician Assistant

## 2018-03-29 ENCOUNTER — Encounter: Payer: Self-pay | Admitting: Physician Assistant

## 2019-02-13 ENCOUNTER — Ambulatory Visit
Admission: EM | Admit: 2019-02-13 | Discharge: 2019-02-13 | Disposition: A | Discharge status: Home / Self Care | Payer: BC Managed Care – PPO | Attending: Physician Assistant | Admitting: Physician Assistant

## 2019-02-13 ENCOUNTER — Other Ambulatory Visit: Payer: Self-pay

## 2019-02-13 DIAGNOSIS — J029 Acute pharyngitis, unspecified: Secondary | ICD-10-CM | POA: Diagnosis not present

## 2019-02-13 DIAGNOSIS — H9201 Otalgia, right ear: Secondary | ICD-10-CM | POA: Diagnosis not present

## 2019-02-13 MED ORDER — AZELASTINE HCL 0.1 % NA SOLN: 2.0000 | Freq: Two times a day (BID) | NASAL | 0 refills | Status: AC

## 2019-02-13 MED ORDER — FLUTICASONE PROPIONATE 50 MCG/ACT NA SUSP: 2.0000 | Freq: Every day | NASAL | 0 refills | Status: AC

## 2019-02-13 NOTE — ED Triage Notes (Signed)
Pt presents with complaints of headache, sore throat, and right ear ache that started yesterday. Denies any fevers at home. Denies hearing loss in her right ear. Pt is not concerned for covid and refused test during patient intake.

## 2019-02-13 NOTE — Discharge Instructions (Signed)
As discussed, cannot rule out COVID causing symptoms. As per your preference, testing was deferred. Please remain in quarantine until symptoms resolve. Start flonase as directed daily, if symptoms not improving in 4-5 days, add on azelastine as directed. If symptoms not improving after 2 weeks of medicine use, follow up with ENT for further evaluation needed.

## 2019-02-13 NOTE — ED Provider Notes (Signed)
[facility]    CSN: [id number] Arrival date & time: 02/13/19  0818      History   Chief Complaint Chief Complaint  Patient presents with  . Otalgia  . Headache  . Sore Throat    HPI Krystal Benitez is a 28 y.o. female.   28 year old female comes in for 2-day history of URI symptoms.  She has had sore throat, headache, right ear pain.  Denies right ear drainage, muffled hearing.  Denies injury/trauma. Denies rhinorrhea, nasal congestion, cough. Denies fever, chills, body aches. Denies abdominal pain, nausea, vomiting, diarrhea. Denies shortness of breath, loss of taste/smell.  No known sick/Covid contact.  Has not taken anything for the symptoms.     Past Medical History:  Diagnosis Date  . Anemia   . Dysrhythmia    tachycardia  . History of anemia as a child   . Hx of blood clots   . Retinal vein occlusion   . Vaginal Pap smear, abnormal     Patient Active Problem List   Diagnosis Date Noted  . S/P cesarean section 04/03/2017    Past Surgical History:  Procedure Laterality Date  . CESAREAN SECTION N/A 04/03/2017   Procedure: CESAREAN SECTION;  Surgeon: Mitchel HonourMorris, Megan, DO;  Location: [facility];  Service: Obstetrics;  Laterality: N/A;  Primary edc 04/10/17 allergic to zpack and ancef Heather K RNFA  . DILATION AND CURETTAGE OF UTERUS  2013  . MR LOWER LEG RIGHT ([facility] HX)     muscular surgery    OB History    Gravida  2   Para  1   Term  1   Preterm  0   AB  1   Living  1     SAB  1   TAB  0   Ectopic  0   Multiple  0   Live Births  1            Home Medications    Prior to Admission medications   Medication Sig Start Date End Date Taking? Authorizing Provider  Prenatal Vit-Fe Fumarate-FA (PRENATAL VITAMIN PO) Take 1 tablet by mouth daily.    Yes [provider]  azelastine (ASTELIN) 0.1 % nasal spray Place 2 sprays into both nostrils 2 (two) times daily. 02/13/19   Cathie HoopsYu, Amy V, PA-C  fluticasone (FLONASE)  50 MCG/ACT nasal spray Place 2 sprays into both nostrils daily. 02/13/19   Cathie HoopsYu, Amy V, PA-C  heparin [id number] UNIT/ML injection Inject 12,500 Units into the skin every 12 (twelve) hours.  01/19/17 02/13/19  [provider]  omeprazole (PRILOSEC) 40 MG capsule Take 40 mg by mouth daily as needed (acid reflux).  02/13/19  [provider]    Family History Family History  Problem Relation Age of Onset  . Heart disease Father   . Hypertension Father   . Clotting disorder Father        MULTIPLE BLOOD CLOTS. ON HEPARIN , ASA  . COPD Maternal Grandfather   . Rheum arthritis Paternal Grandmother   . Pancreatic cancer Paternal Grandmother   . Healthy Mother     Social History Social History   Tobacco Use  . Smoking status: Never Smoker  . Smokeless tobacco: Never Used  Substance Use Topics  . Alcohol use: No  . Drug use: No     Allergies   Azithromycin and Cefazolin   Review of Systems Review of Systems  Reason unable to perform ROS: See HPI as above.  Physical Exam Triage Vital Signs ED Triage Vitals  Enc Vitals Group     BP 02/13/19 0827 121/81     Pulse Rate 02/13/19 0827 80     Resp 02/13/19 0827 16     Temp 02/13/19 0827 98.4 F (36.9 C)     Temp Source 02/13/19 0827 Oral     SpO2 02/13/19 0827 98 %     Weight --      Height --      Head Circumference --      Peak Flow --      Pain Score 02/13/19 0832 6     Pain Loc --      Pain Edu? --      Excl. in GC? --    No data found.  Updated Vital Signs BP 121/81 (BP Location: Left Arm)   Pulse 80   Temp 98.4 F (36.9 C) (Oral)   Resp 16   LMP 02/08/2019   SpO2 98%   Physical Exam Constitutional:      General: She is not in acute distress.    Appearance: Normal appearance. She is not ill-appearing, toxic-appearing or diaphoretic.  HENT:     Head: Normocephalic and atraumatic.     Right Ear: Tympanic membrane, ear canal and external ear normal. Tympanic membrane is not erythematous or  bulging.     Left Ear: Tympanic membrane, ear canal and external ear normal. Tympanic membrane is not erythematous or bulging.     Ears:     Comments: No tender to palpation of bilateral tragus.     Nose:     Right Sinus: No maxillary sinus tenderness or frontal sinus tenderness.     Left Sinus: No maxillary sinus tenderness or frontal sinus tenderness.     Mouth/Throat:     Mouth: Mucous membranes are moist.     Pharynx: Oropharynx is clear. Uvula midline.  Neck:     Musculoskeletal: Normal range of motion and neck supple.  Cardiovascular:     Rate and Rhythm: Normal rate and regular rhythm.     Heart sounds: Normal heart sounds. No murmur. No friction rub. No gallop.   Pulmonary:     Effort: Pulmonary effort is normal. No accessory muscle usage, prolonged expiration, respiratory distress or retractions.     Comments: Lungs clear to auscultation without adventitious lung sounds. Neurological:     General: No focal deficit present.     Mental Status: She is alert and oriented to person, place, and time.      UC Treatments / Results  Labs (all labs ordered are listed, but only abnormal results are displayed) Labs Reviewed - No data to display  EKG   Radiology No results found.  Procedures Procedures (including critical care time)  Medications Ordered in UC Medications - No data to display  Initial Impression / Assessment and Plan / UC Course  I have reviewed the triage vital signs and the nursing notes.  Pertinent labs & imaging results that were available during my care of the patient were reviewed by me and considered in my medical decision making (see chart for details).    Discussed with patient cannot rule out COVID as cause of symptoms.  Patient declined Covid testing in office.  Discussed risks and benefits, patient expresses understanding, but continues to decline testing.  Ear exam without signs of otitis media or externa.  Discussed possible eustachian tube  dysfunction causing symptoms.  Start Flonase as directed.  If symptoms not  improving, to add on azelastine.  Discussed if symptoms do not improving, to follow-up with ENT for further evaluation.  Return precautions given.  Final Clinical Impressions(s) / UC Diagnoses   Final diagnoses:  Right ear pain  Sore throat    ED Prescriptions    Medication Sig Dispense Auth. Provider   azelastine (ASTELIN) 0.1 % nasal spray Place 2 sprays into both nostrils 2 (two) times daily. 30 mL Yu, Amy V, PA-C   fluticasone (FLONASE) 50 MCG/ACT nasal spray Place 2 sprays into both nostrils daily. 1 g Ok Edwards, PA-C     PDMP not reviewed this encounter.   Ok Edwards, PA-C 02/13/19 [id number]

## 2019-02-18 ENCOUNTER — Ambulatory Visit
Admission: EM | Admit: 2019-02-18 | Discharge: 2019-02-18 | Disposition: A | Discharge status: Home / Self Care | Payer: BC Managed Care – PPO | Attending: Emergency Medicine | Admitting: Emergency Medicine

## 2019-02-18 DIAGNOSIS — J029 Acute pharyngitis, unspecified: Secondary | ICD-10-CM

## 2019-02-18 DIAGNOSIS — Z20828 Contact with and (suspected) exposure to other viral communicable diseases: Secondary | ICD-10-CM

## 2019-02-18 NOTE — Discharge Instructions (Addendum)
Your COVID test is pending - it is important to quarantine / isolate at home until your results are back. °If you test positive and would like further evaluation for persistent or worsening symptoms, you may schedule an E-visit or virtual (video) visit throughout the [facility] MyChart app or website. ° °PLEASE NOTE: If you develop severe chest pain or shortness of breath please go to the ER or call 9-1-1 for further evaluation --> DO NOT schedule electronic or virtual visits for this. °Please call our office for further guidance / recommendations as needed. °

## 2019-02-18 NOTE — ED Provider Notes (Signed)
[facility]    CSN: [id number] Arrival date & time: 02/18/19  0941      History   Chief Complaint Chief Complaint  Patient presents with  . Sore Throat    HPI Krystal Benitez is a 28 y.o. female   Presenting for Covid testing: Exposure: Family member Date of exposure: 11/27 Any fever, symptoms since exposure: Yes Bilateral ear aches, generalized headache, sore throat, nasal congestion.  Patient previously evaluated for this on 11/29: Given azelastine and fluticasone nasal sprays.  Has been using these without significant relief.  Past Medical History:  Diagnosis Date  . Anemia   . Dysrhythmia    tachycardia  . History of anemia as a child   . Hx of blood clots   . Retinal vein occlusion   . Vaginal Pap smear, abnormal     Patient Active Problem List   Diagnosis Date Noted  . S/P cesarean section 04/03/2017    Past Surgical History:  Procedure Laterality Date  . CESAREAN SECTION N/A 04/03/2017   Procedure: CESAREAN SECTION;  Surgeon: Mitchel HonourMorris, Megan, DO;  Location: [facility];  Service: Obstetrics;  Laterality: N/A;  Primary edc 04/10/17 allergic to zpack and ancef Heather K RNFA  . DILATION AND CURETTAGE OF UTERUS  2013  . MR LOWER LEG RIGHT ([facility] HX)     muscular surgery    OB History    Gravida  2   Para  1   Term  1   Preterm  0   AB  1   Living  1     SAB  1   TAB  0   Ectopic  0   Multiple  0   Live Births  1            Home Medications    Prior to Admission medications   Medication Sig Start Date End Date Taking? Authorizing Provider  azelastine (ASTELIN) 0.1 % nasal spray Place 2 sprays into both nostrils 2 (two) times daily. 02/13/19   Cathie HoopsYu, Amy V, PA-C  fluticasone (FLONASE) 50 MCG/ACT nasal spray Place 2 sprays into both nostrils daily. 02/13/19   Belinda FisherYu, Amy V, PA-C  Prenatal Vit-Fe Fumarate-FA (PRENATAL VITAMIN PO) Take 1 tablet by mouth daily.     [provider]  heparin [id number] UNIT/ML injection  Inject 12,500 Units into the skin every 12 (twelve) hours.  01/19/17 02/13/19  [provider]  omeprazole (PRILOSEC) 40 MG capsule Take 40 mg by mouth daily as needed (acid reflux).  02/13/19  [provider]    Family History Family History  Problem Relation Age of Onset  . Heart disease Father   . Hypertension Father   . Clotting disorder Father        MULTIPLE BLOOD CLOTS. ON HEPARIN , ASA  . COPD Maternal Grandfather   . Rheum arthritis Paternal Grandmother   . Pancreatic cancer Paternal Grandmother   . Healthy Mother     Social History Social History   Tobacco Use  . Smoking status: Never Smoker  . Smokeless tobacco: Never Used  Substance Use Topics  . Alcohol use: No  . Drug use: No     Allergies   Azithromycin and Cefazolin   Review of Systems Review of Systems  Constitutional: Negative for fatigue and fever.  HENT: Positive for congestion, ear pain, rhinorrhea and sore throat. Negative for dental problem, ear discharge, facial swelling, hearing loss, sinus pain, trouble swallowing and voice change.   Eyes: Negative  for photophobia, pain and visual disturbance.  Respiratory: Negative for cough and shortness of breath.   Cardiovascular: Negative for chest pain and palpitations.  Gastrointestinal: Negative for abdominal pain, blood in stool, diarrhea, nausea and vomiting.  Musculoskeletal: Negative for arthralgias and myalgias.  Neurological: Positive for headaches. Negative for dizziness, facial asymmetry and light-headedness.     Physical Exam Triage Vital Signs ED Triage Vitals  Enc Vitals Group     BP      Pulse      Resp      Temp      Temp src      SpO2      Weight      Height      Head Circumference      Peak Flow      Pain Score      Pain Loc      Pain Edu?      Excl. in GC?    No data found.  Updated Vital Signs BP 123/81 (BP Location: Left Arm)   Pulse 86   Temp 98.9 F (37.2 C) (Oral)   Resp 16   LMP 02/08/2019    SpO2 97%   Visual Acuity Right Eye Distance:   Left Eye Distance:   Bilateral Distance:    Right Eye Near:   Left Eye Near:    Bilateral Near:     Physical Exam Constitutional:      General: She is not in acute distress.    Appearance: She is normal weight. She is not ill-appearing.  HENT:     Head: Normocephalic and atraumatic.     Jaw: There is normal jaw occlusion. No tenderness or pain on movement.     Right Ear: Hearing, tympanic membrane, ear canal and external ear normal. No tenderness. No mastoid tenderness.     Left Ear: Hearing, tympanic membrane, ear canal and external ear normal. No tenderness. No mastoid tenderness.     Ears:     Comments: No fluid, suppurativa behind TMs bilaterally    Nose: No nasal deformity, septal deviation or nasal tenderness.     Right Turbinates: Not swollen or pale.     Left Turbinates: Not swollen or pale.     Right Sinus: No maxillary sinus tenderness or frontal sinus tenderness.     Left Sinus: No maxillary sinus tenderness or frontal sinus tenderness.     Comments: Nature nonedematous, mildly injected mucosa    Mouth/Throat:     Lips: Pink. No lesions.     Mouth: Mucous membranes are moist. No injury.     Pharynx: Oropharynx is clear. Uvula midline. No posterior oropharyngeal erythema or uvula swelling.     Comments: no tonsillar exudate or hypertrophy Neck:     Musculoskeletal: Normal range of motion and neck supple. No muscular tenderness.     Comments: mild, shotty bilateral anterior cervical chain lymphadenopathy that is nontender Cardiovascular:     Rate and Rhythm: Normal rate.  Pulmonary:     Effort: Pulmonary effort is normal. No respiratory distress.     Breath sounds: No wheezing.  Skin:    Capillary Refill: Capillary refill takes less than 2 seconds.  Neurological:     General: No focal deficit present.     Mental Status: She is alert and oriented to person, place, and time.      UC Treatments / Results  Labs  (all labs ordered are listed, but only abnormal results are displayed) Labs Reviewed  NOVEL  CORONAVIRUS, NAA    EKG   Radiology No results found.  Procedures Procedures (including critical care time)  Medications Ordered in UC Medications - No data to display  Initial Impression / Assessment and Plan / UC Course  I have reviewed the triage vital signs and the nursing notes.  Pertinent labs & imaging results that were available during my care of the patient were reviewed by me and considered in my medical decision making (see chart for details).     Patient afebrile, nontoxic.  No obvious signs of ear or sinus infection on exam today.  Covid PCR pending: Patient to continue quarantine until 14 days from exposure.  We will continue supportive care.  Return precautions discussed, patient verbalized understanding and is agreeable to plan. Final Clinical Impressions(s) / UC Diagnoses   Final diagnoses:  Sore throat     Discharge Instructions     Your COVID test is pending - it is important to quarantine / isolate at home until your results are back. If you test positive and would like further evaluation for persistent or worsening symptoms, you may schedule an E-visit or virtual (video) visit throughout the [facility] app or website.  PLEASE NOTE: If you develop severe chest pain or shortness of breath please go to the ER or call 9-1-1 for further evaluation --> DO NOT schedule electronic or virtual visits for this. Please call our office for further guidance / recommendations as needed.    ED Prescriptions    None     PDMP not reviewed this encounter.   Hall[place]Potvin, [place], [place] 02/18/19 1021

## 2019-02-18 NOTE — ED Triage Notes (Signed)
Pt c/o sore throat, headache, congestion, and lt ear ache since Saturday. States vomiting x3 since Monday. Pt states positive COVID exposure 11/27, requesting testing.

## 2019-02-21 LAB — NOVEL CORONAVIRUS, NAA: SARS-CoV-2, NAA: NOT DETECTED

## 2019-12-17 ENCOUNTER — Encounter (HOSPITAL_COMMUNITY): Payer: Self-pay | Admitting: Obstetrics and Gynecology

## 2019-12-17 ENCOUNTER — Inpatient Hospital Stay (HOSPITAL_COMMUNITY)
Admission: AD | Admit: 2019-12-17 | Discharge: 2019-12-17 | Disposition: A | Discharge status: Home / Self Care | Payer: BC Managed Care – PPO | Attending: Obstetrics and Gynecology | Admitting: Obstetrics and Gynecology

## 2019-12-17 ENCOUNTER — Other Ambulatory Visit: Payer: Self-pay

## 2019-12-17 ENCOUNTER — Inpatient Hospital Stay (HOSPITAL_COMMUNITY): Payer: BC Managed Care – PPO

## 2019-12-17 DIAGNOSIS — O074 Failed attempted termination of pregnancy without complication: Secondary | ICD-10-CM

## 2019-12-17 DIAGNOSIS — Z3491 Encounter for supervision of normal pregnancy, unspecified, first trimester: Secondary | ICD-10-CM

## 2019-12-17 DIAGNOSIS — Z3A01 Less than 8 weeks gestation of pregnancy: Secondary | ICD-10-CM | POA: Diagnosis not present

## 2019-12-17 DIAGNOSIS — O039 Complete or unspecified spontaneous abortion without complication: Secondary | ICD-10-CM | POA: Insufficient documentation

## 2019-12-17 DIAGNOSIS — O209 Hemorrhage in early pregnancy, unspecified: Secondary | ICD-10-CM

## 2019-12-17 LAB — COMPREHENSIVE METABOLIC PANEL
ALT: 32 U/L (ref 0–44)
AST: 22 U/L (ref 15–41)
Albumin: 4.1 g/dL (ref 3.5–5.0)
Alkaline Phosphatase: 46 U/L (ref 38–126)
Anion gap: 13 (ref 5–15)
BUN: 7 mg/dL (ref 6–20)
CO2: 23 mmol/L (ref 22–32)
Calcium: 9.6 mg/dL (ref 8.9–10.3)
Chloride: 102 mmol/L (ref 98–111)
Creatinine, Ser: 0.71 mg/dL (ref 0.44–1.00)
GFR calc Af Amer: >60 mL/min (ref >60)
GFR calc non Af Amer: >60 mL/min (ref >60)
Glucose, Bld: 99 mg/dL (ref 70–99)
Potassium: 3.8 mmol/L (ref 3.5–5.1)
Sodium: 138 mmol/L (ref 135–145)
Total Bilirubin: 0.3 mg/dL (ref 0.3–1.2)
Total Protein: 7.2 g/dL (ref 6.5–8.1)

## 2019-12-17 LAB — CBC
HCT: 36.4 % (ref 36.0–46.0)
Hemoglobin: 12.6 g/dL (ref 12.0–15.0)
MCH: 29.5 pg (ref 26.0–34.0)
MCHC: 34.6 g/dL (ref 30.0–36.0)
MCV: 85.2 fL (ref 80.0–100.0)
Platelets: 338 10*3/uL (ref 150–400)
RBC: 4.27 MIL/uL (ref 3.87–5.11)
RDW: 11.9 % (ref 11.5–15.5)
WBC: 9.6 10*3/uL (ref 4.0–10.5)
nRBC: 0 % (ref 0.0–0.2)

## 2019-12-17 LAB — URINALYSIS, ROUTINE W REFLEX MICROSCOPIC
Bilirubin Urine: NEGATIVE
Glucose, UA: NEGATIVE mg/dL
Ketones, ur: NEGATIVE mg/dL
Leukocytes,Ua: NEGATIVE
Nitrite: NEGATIVE
Protein, ur: 30 mg/dL — AB
Specific Gravity, Urine: 1.016 (ref 1.005–1.030)
pH: 8 (ref 5.0–8.0)

## 2019-12-17 LAB — WET PREP, GENITAL
Sperm: NONE SEEN
Trich, Wet Prep: NONE SEEN
Yeast Wet Prep HPF POC: NONE SEEN

## 2019-12-17 LAB — POCT PREGNANCY, URINE: Preg Test, Ur: POSITIVE — AB

## 2019-12-17 LAB — HCG, QUANTITATIVE, PREGNANCY: hCG, Beta Chain, Quant, S: 57149 m[IU]/mL — ABNORMAL HIGH (ref <5)

## 2019-12-17 MED ORDER — ONDANSETRON 4 MG PO TBDP
4.0000 mg | ORAL_TABLET | Freq: Once | ORAL | Status: AC
  Administered 2019-12-17: 11:00:00 4 mg via ORAL
  Filled 2019-12-17: qty 1

## 2019-12-17 NOTE — MAU Note (Signed)
Krystal Benitez is a 29 y.o. at Unknown here in MAU reporting: +HPT LMP: 11/01/19  +fever Highest 101 at home  +n/v  +lower abdominal pain Cramping Intermittent Pain score: 3/10  +vaginal bleeding Verbalizes that she is having a miscarriage.   Onset of complaint: tuesday Called the on call line at [facility] and was advised to come in for evaluation. Vitals:   12/17/19 0938  BP: 112/70  Pulse: 82  Resp: 16  Temp: 98.9 F (37.2 C)  SpO2: 100%       Lab orders placed from triage: poc pregnancy test/UA

## 2019-12-17 NOTE — MAU Provider Note (Signed)
History     CSN: [id number]  Arrival date and time: 12/17/19 8416   First Provider Initiated Contact with Patient 12/17/19 [phone]      Chief Complaint  Patient presents with  . Miscarriage  . Abdominal Pain   HPI Krystal Benitez is a 29 y.o. G3P1011 at [redacted]w[redacted]d who presents to MAU with chief complaint of low grade fever, abdominal pain, and heavy vaginal bleeding. These are new problems, new onset Tuesday. She endorses TMAX of 101.  Patient initially stated spontaneous occurrence of aforementioned symptoms. At discharge she stated she took Mifepristone and Misoprostol for an elective termination. She received the medication from [facility].    Patient requests elective termination in MAU today. She previously received care with [facility] and asks if the on-call provider can come to MAU to terminate her pregnancy or "find a diagnosis that lets me terminate the pregnancy".  OB History    Gravida  3   Para  1   Term  1   Preterm  0   AB  1   Living  1     SAB  1   TAB  0   Ectopic  0   Multiple  0   Live Births  1           Past Medical History:  Diagnosis Date  . Anemia   . Dysrhythmia    tachycardia  . History of anemia as a child   . Hx of blood clots   . Retinal vein occlusion   . Vaginal Pap smear, abnormal     Past Surgical History:  Procedure Laterality Date  . CESAREAN SECTION N/A 04/03/2017   Procedure: CESAREAN SECTION;  Surgeon: Mitchel Honour, DO;  Location: [facility];  Service: Obstetrics;  Laterality: N/A;  Primary edc 04/10/17 allergic to zpack and ancef Heather K RNFA  . DILATION AND CURETTAGE OF UTERUS  2013  . MR LOWER LEG RIGHT ([facility] HX)     muscular surgery    Family History  Problem Relation Age of Onset  . Heart disease Father   . Hypertension Father   . Clotting disorder Father        MULTIPLE BLOOD CLOTS. ON HEPARIN , ASA  . COPD Maternal Grandfather   . Rheum arthritis Paternal Grandmother   .  Pancreatic cancer Paternal Grandmother   . Healthy Mother     Social History   Tobacco Use  . Smoking status: Never Smoker  . Smokeless tobacco: Never Used  Vaping Use  . Vaping Use: Never used  Substance Use Topics  . Alcohol use: No  . Drug use: No    Allergies:  Allergies  Allergen Reactions  . Azithromycin Hives  . Cefazolin Hives and Rash    Pt states she can tolerate Pen, Amox and Augmentin    Medications Prior to Admission  Medication Sig Dispense Refill Last Dose  . azelastine (ASTELIN) 0.1 % nasal spray Place 2 sprays into both nostrils 2 (two) times daily. 30 mL 0   . fluticasone (FLONASE) 50 MCG/ACT nasal spray Place 2 sprays into both nostrils daily. 1 g 0   . Prenatal Vit-Fe Fumarate-FA (PRENATAL VITAMIN PO) Take 1 tablet by mouth daily.        Review of Systems  Constitutional: Positive for chills, fatigue and fever.  Gastrointestinal: Positive for abdominal pain.  Genitourinary: Positive for vaginal bleeding.  All other systems reviewed and are negative.  Physical Exam   Blood pressure  112/70, pulse 82, temperature 98.9 F (37.2 C), temperature source Oral, resp. rate 16, weight 80.3 kg, last menstrual period 11/01/2019, SpO2 100 %, unknown if currently breastfeeding.  Physical Exam Vitals and nursing note reviewed. Exam conducted with a chaperone present.  Constitutional:      Appearance: She is well-developed.  Cardiovascular:     Rate and Rhythm: Normal rate.     Heart sounds: Normal heart sounds.  Pulmonary:     Effort: Pulmonary effort is normal.     Breath sounds: Normal breath sounds.  Abdominal:     General: Abdomen is flat. Bowel sounds are normal.     Palpations: Abdomen is soft.     Tenderness: There is no abdominal tenderness. There is no right CVA tenderness or left CVA tenderness.  Genitourinary:    Vagina: Normal. No bleeding.     Cervix: Normal.     Uterus: Normal.      Comments: Pelvic exam: External genitalia normal, vaginal  walls pink and well rugated, cervix visually closed, no lesions noted.   Skin:    General: Skin is warm and dry.     Capillary Refill: Capillary refill takes less than 2 seconds.  Neurological:     General: No focal deficit present.     Mental Status: She is alert.  Psychiatric:        Mood and Affect: Mood normal.     MAU Course/MDM  Procedures  Orders Placed This Encounter  Procedures  . Wet prep, genital  . US OB LESS THAN 14 WEEKS WITH OB TRANSVAGINAL  . Urinalysis, Routine w reflex microscopic Urine, Clean Catch  . CBC  . Comprehensive metabolic panel  . hCG, quantitative, pregnancy  . Pregnancy, urine POC   Patient Vitals for the past 24 hrs:  BP Temp Temp src Pulse Resp SpO2 Weight  12/17/19 1143 116/70 -- -- 86 16 100 % --  12/17/19 0938 112/70 98.9 F (37.2 C) Oral 82 16 100 % 80.3 kg   Results for orders placed or performed during the hospital encounter of 12/17/19 (from the past 24 hour(s))  Urinalysis, Routine w reflex microscopic PATH Cytology Cervicovaginal Ancillary Only     Status: Abnormal   Collection Time: 12/17/19  9:37 AM  Result Value Ref Range   Color, Urine YELLOW YELLOW   APPearance HAZY (A) CLEAR   Specific Gravity, Urine 1.016 1.005 - 1.030   pH 8.0 5.0 - 8.0   Glucose, UA NEGATIVE NEGATIVE mg/dL   Hgb urine dipstick LARGE (A) NEGATIVE   Bilirubin Urine NEGATIVE NEGATIVE   Ketones, ur NEGATIVE NEGATIVE mg/dL   Protein, ur 30 (A) NEGATIVE mg/dL   Nitrite NEGATIVE NEGATIVE   Leukocytes,Ua NEGATIVE NEGATIVE   RBC / HPF 0-5 0 - 5 RBC/hpf   WBC, UA 0-5 0 - 5 WBC/hpf   Bacteria, UA RARE (A) NONE SEEN   Squamous Epithelial / LPF 11-20 0 - 5  Pregnancy, urine POC     Status: Abnormal   Collection Time: 12/17/19  9:39 AM  Result Value Ref Range   Preg Test, Ur POSITIVE (A) NEGATIVE  CBC     Status: None   Collection Time: 12/17/19 10:00 AM  Result Value Ref Range   WBC 9.6 4.0 - 10.5 K/uL   RBC 4.27 3.87 - 5.11 MIL/uL   Hemoglobin 12.6  12.0 - 15.0 g/dL   HCT 40.936.4 36 - 46 %   MCV 85.2 80.0 - 100.0 fL   MCH 29.5 26.0 -  34.0 pg   MCHC 34.6 30.0 - 36.0 g/dL   RDW 37.9 02.4 - 09.7 %   Platelets 338 150 - 400 K/uL   nRBC 0.0 0.0 - 0.2 %  Comprehensive metabolic panel     Status: None   Collection Time: 12/17/19 10:00 AM  Result Value Ref Range   Sodium 138 135 - 145 mmol/L   Potassium 3.8 3.5 - 5.1 mmol/L   Chloride 102 98 - 111 mmol/L   CO2 23 22 - 32 mmol/L   Glucose, Bld 99 70 - 99 mg/dL   BUN 7 6 - 20 mg/dL   Creatinine, Ser 3.53 0.44 - 1.00 mg/dL   Calcium 9.6 8.9 - 29.9 mg/dL   Total Protein 7.2 6.5 - 8.1 g/dL   Albumin 4.1 3.5 - 5.0 g/dL   AST 22 15 - 41 U/L   ALT 32 0 - 44 U/L   Alkaline Phosphatase 46 38 - 126 U/L   Total Bilirubin 0.3 0.3 - 1.2 mg/dL   GFR calc non Af Amer >60 >60 mL/min   GFR calc Af Amer >60 >60 mL/min   Anion gap 13 5 - 15  hCG, quantitative, pregnancy     Status: Abnormal   Collection Time: 12/17/19 10:00 AM  Result Value Ref Range   hCG, Beta Chain, Quant, S 57,149 (H) <5 mIU/mL  Wet prep, genital     Status: Abnormal   Collection Time: 12/17/19 10:06 AM   Specimen: PATH Cytology Cervicovaginal Ancillary Only  Result Value Ref Range   Yeast Wet Prep HPF POC NONE SEEN NONE SEEN   Trich, Wet Prep NONE SEEN NONE SEEN   Clue Cells Wet Prep HPF POC PRESENT (A) NONE SEEN   WBC, Wet Prep HPF POC FEW (A) NONE SEEN   Sperm NONE SEEN    US OB LESS THAN 14 WEEKS WITH OB TRANSVAGINAL  Result Date: 12/17/2019 CLINICAL DATA:  Vaginal bleeding EXAM: OBSTETRIC <14 WK [place] AND TRANSVAGINAL OB US TECHNIQUE: Both transabdominal and transvaginal ultrasound examinations were performed for complete evaluation of the gestation as well as the maternal uterus, adnexal regions, and pelvic cul-de-sac. Transvaginal technique was performed to assess early pregnancy. COMPARISON:  None. FINDINGS: Intrauterine gestational sac: Single Yolk sac:  Visualized. Embryo:  Visualized. Cardiac Activity: Visualized. Heart  Rate: 112 bpm CRL:  5.1 mm   6 w   1 d                  [place] EDC: 08/10/2020 LMP: 11/01/2019. Gestational age by LMP is 6 weeks 4 days. EDC by LMP is 08/07/2020 Subchorionic hemorrhage: Small Right ovary: Visualized transabdominally and transvaginally. Blood flow seen within the ovary. It measures 4.7 x 2.9 x 2.5 cm. It demonstrates a cystic structure which measures 2.1 x 2.3 x 1.9 cm which likely reflects a corpus luteal cyst. Left ovary: Visualized transabdominally and transvaginally. Blood flow seen within the ovary. It measures 2.6 x 1.6 x 1.4 cm. Other :None Free fluid:  None IMPRESSION: 1. There is a single live intrauterine pregnancy with an assigned gestational age by LMP of 6 weeks 4 days. 2.  Small subchorionic hematoma. Electronically Signed   By: Meda Klinefelter MD   On: 12/17/2019 11:23   Assessment and Plan  --29 y.o. G3P1011 at [redacted]w[redacted]d  --Failed elective abortion --SIUP with + Cardiac Flicker  --Discussed that [facility] facility is unable to offer elective terminations --Discharge home in stable condition  F/U: --Encouraged patient to follow up with [facility] or [facility] next week  Calvert Cantor, [place] 12/17/2019, 4:10 PM

## 2019-12-17 NOTE — MAU Note (Signed)
Patient called out for RN that she is vomiting. Sam CNM in dept and aware.

## 2019-12-17 NOTE — Discharge Instructions (Signed)

## 2019-12-19 LAB — GC/CHLAMYDIA PROBE AMP (~~LOC~~) NOT AT ARMC
Chlamydia: NEGATIVE
Comment: NEGATIVE
Comment: NORMAL
Neisseria Gonorrhea: NEGATIVE

## 2020-12-15 IMAGING — US US OB < 14 WEEKS - US OB TV
1 series · 15 of 28 positions shown · non-contrast
Comparison: None.

CLINICAL DATA: Vaginal bleeding

EXAM:
OBSTETRIC <14 WK US AND TRANSVAGINAL OB US
TECHNIQUE: Both transabdominal and transvaginal ultrasound examinations were
performed for complete evaluation of the gestation as well as the
maternal uterus, adnexal regions, and pelvic cul-de-sac.
Transvaginal technique was performed to assess early pregnancy.

[Series 1: us ob < 14 weeks - us ob tv · 15 of 66 slices shown]
[im 1/66]
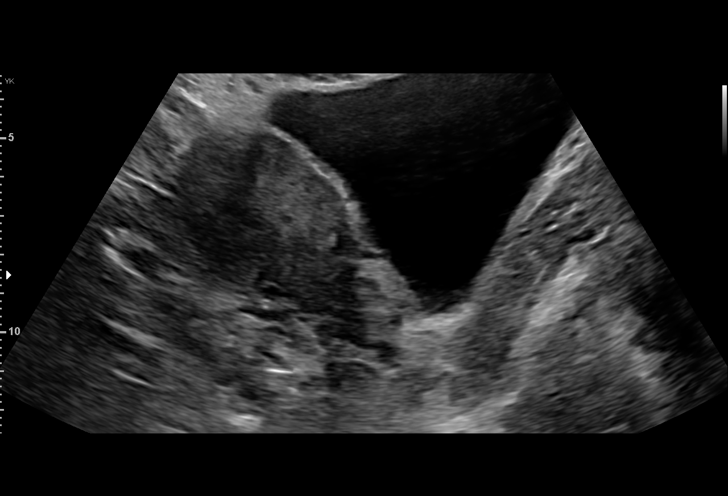
[im 5/66]
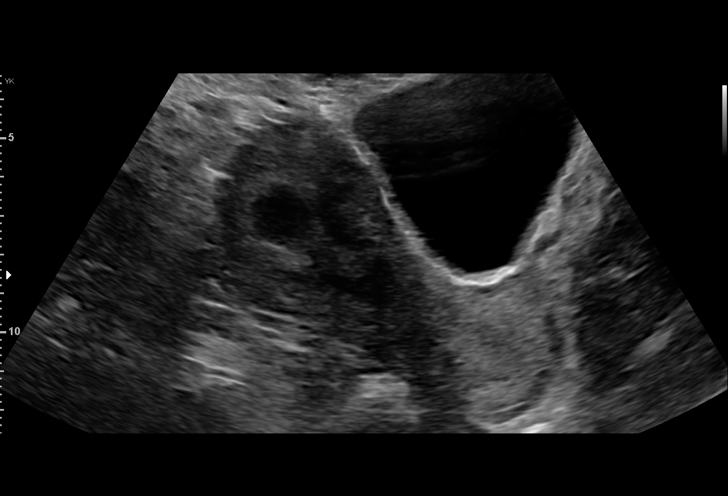
[im 10/66]
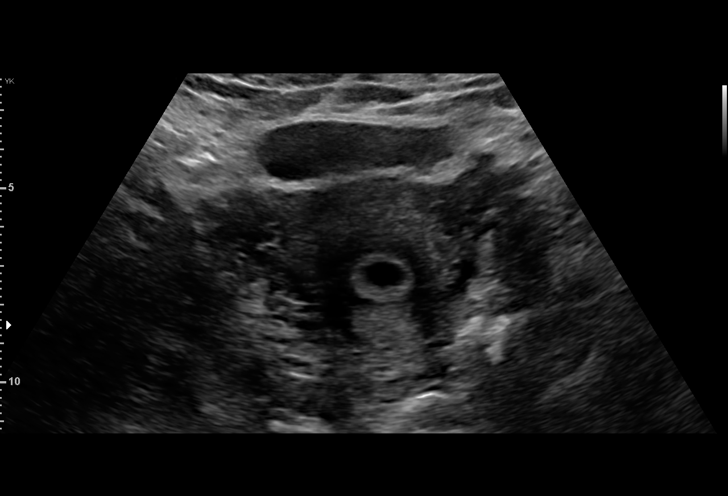
[im 15/66]
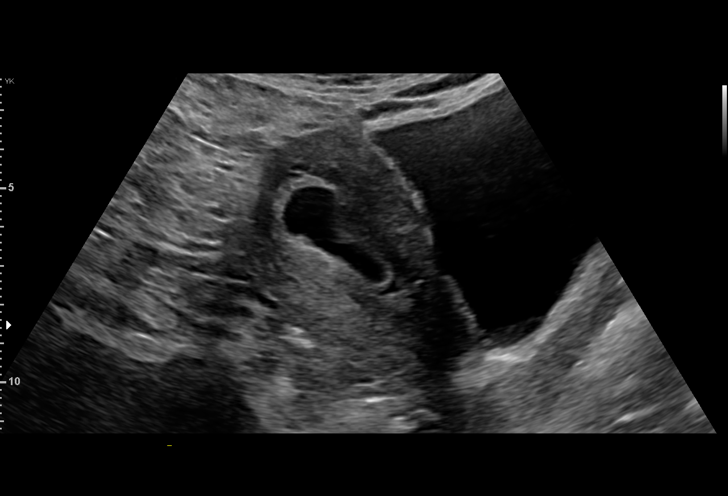
[im 20/66]
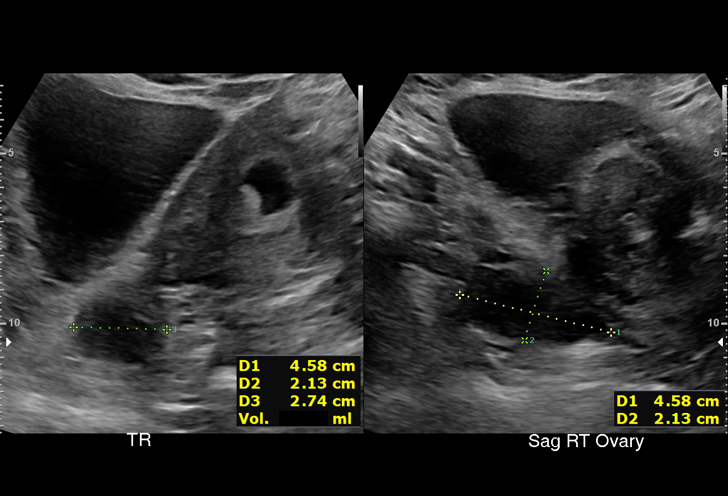
[im 25/66]
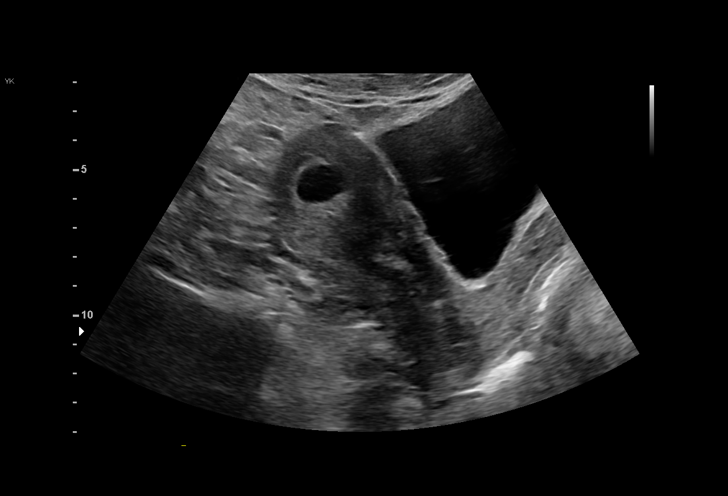
[im 29/66]
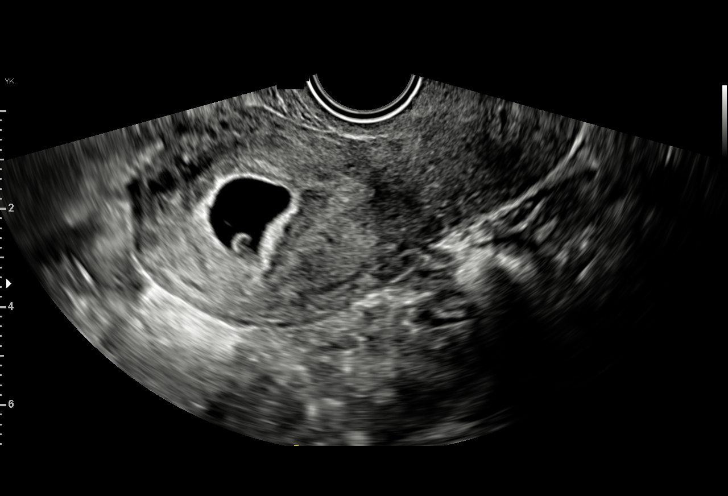
[im 34/66]
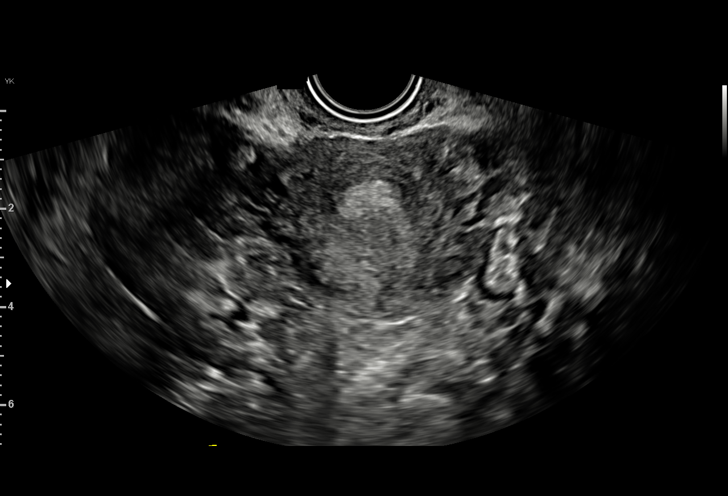
[im 37/66]
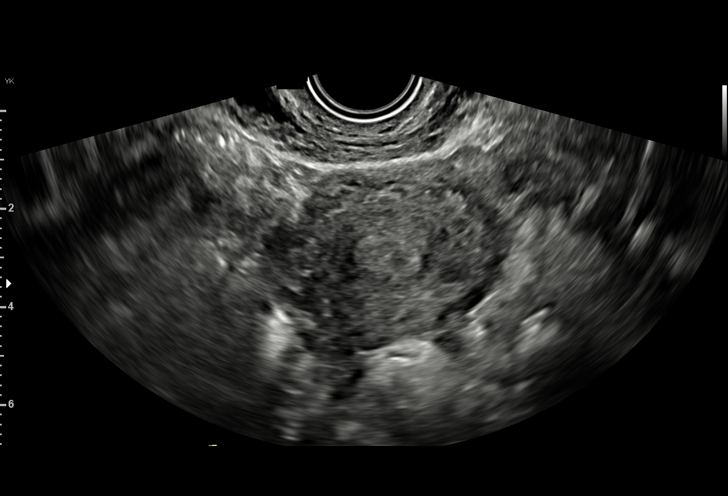
[im 41/66]
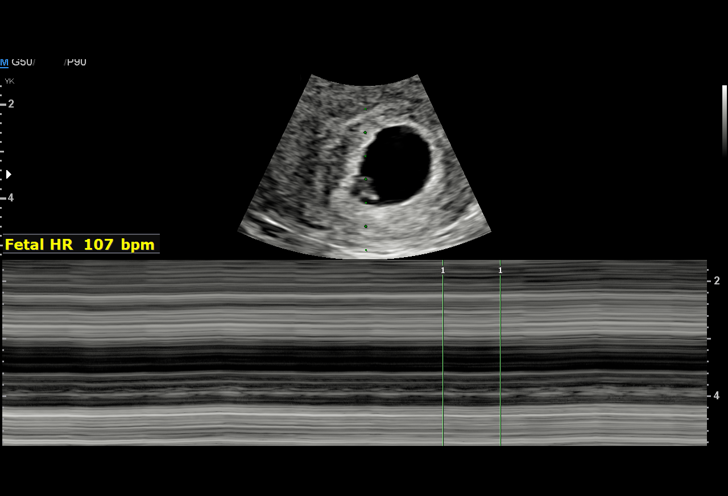
[im 46/66]
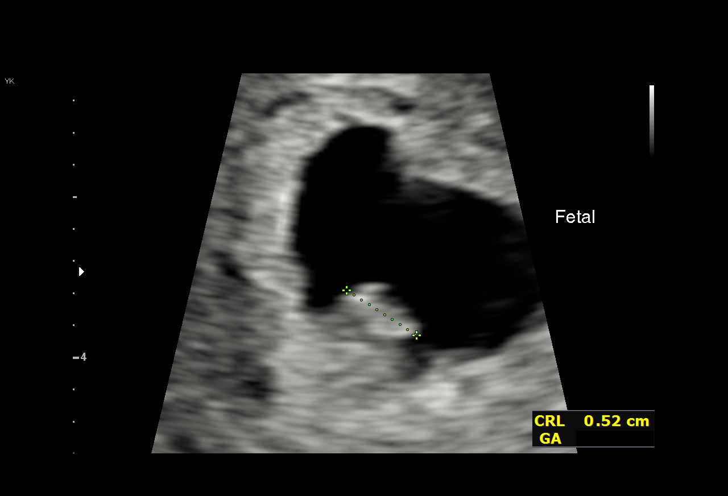
[im 51/66]
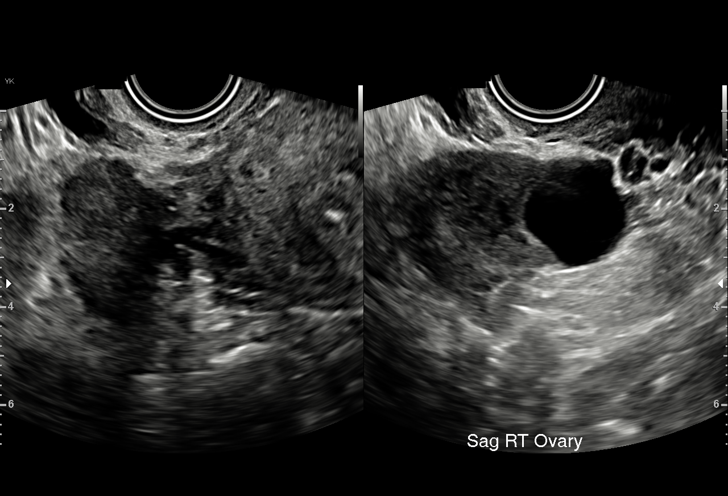
[im 56/66]
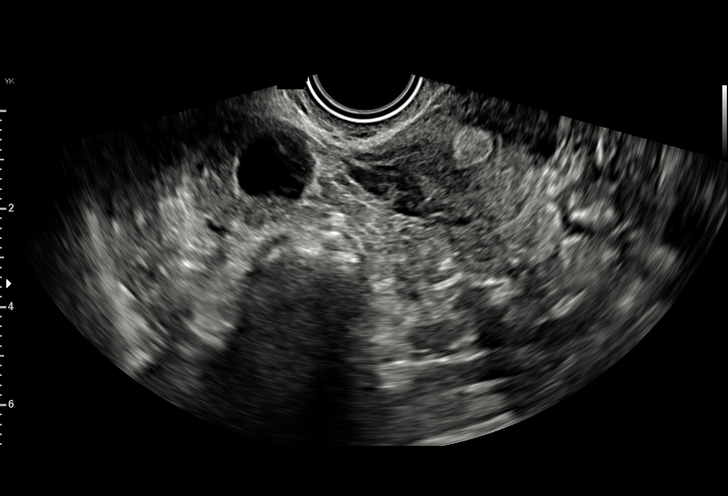
[im 61/66]
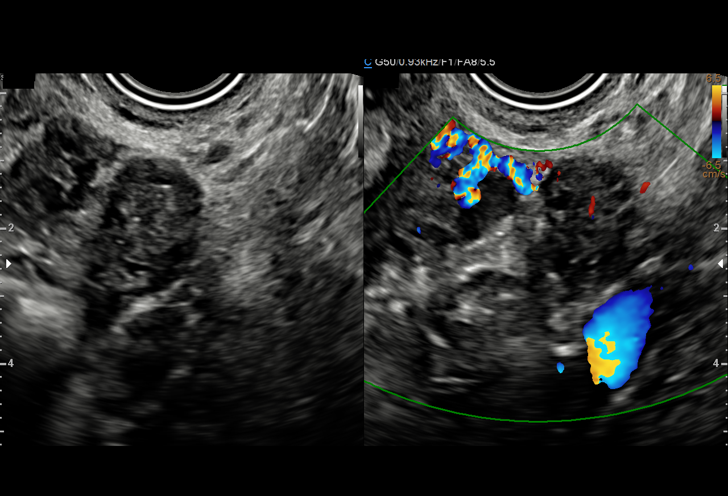
[im 66/66]
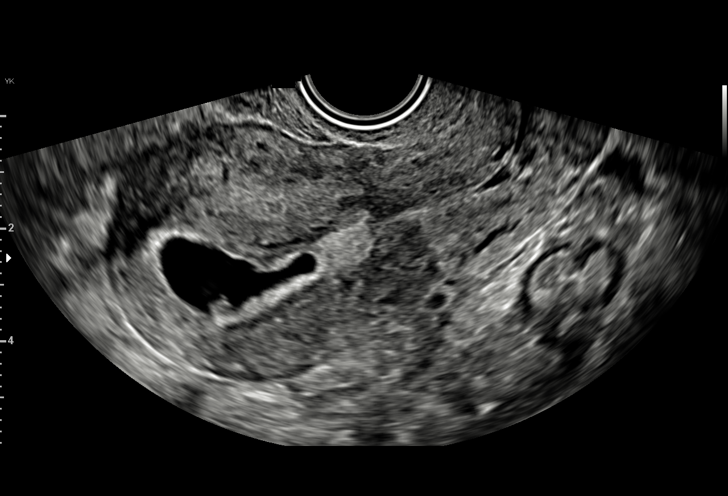

[15 of 28 positions shown; findings below may reference images not displayed]

FINDINGS: Intrauterine gestational sac: Single

Yolk sac:  Visualized.

Embryo:  Visualized.

Cardiac Activity: Visualized.

Heart Rate: 112 bpm

CRL:  5.1 mm   6 w   1 d                  US EDC: 08/10/2020

LMP: 11/01/2019. Gestational age by LMP is 6 weeks 4 days. EDC by
LMP is 08/07/2020

Subchorionic hemorrhage: Small

Right ovary: Visualized transabdominally and transvaginally. Blood
flow seen within the ovary. It measures 4.7 x 2.9 x 2.5 cm. It
demonstrates a cystic structure which measures 2.1 x 2.3 x 1.9 cm
which likely reflects a corpus luteal cyst.

Left ovary: Visualized transabdominally and transvaginally. Blood
flow seen within the ovary. It measures 2.6 x 1.6 x 1.4 cm.

Other :None

Free fluid:  None
IMPRESSION: 1. There is a single live intrauterine pregnancy with an assigned
gestational age by LMP of 6 weeks 4 days.

2.  Small subchorionic hematoma.

## 2022-09-11 ENCOUNTER — Ambulatory Visit: Payer: BC Managed Care – PPO | Admitting: Family Medicine

## 2023-10-11 ENCOUNTER — Other Ambulatory Visit: Payer: Self-pay | Admitting: Medical Genetics

## 2023-10-13 ENCOUNTER — Other Ambulatory Visit

## 2023-12-25 ENCOUNTER — Other Ambulatory Visit: Payer: Self-pay | Admitting: Medical Genetics

## 2023-12-25 ENCOUNTER — Encounter: Payer: Self-pay | Admitting: *Deleted

## 2023-12-25 DIAGNOSIS — Z006 Encounter for examination for normal comparison and control in clinical research program: Secondary | ICD-10-CM

## 2024-03-12 LAB — GENECONNECT MOLECULAR SCREEN: Genetic Analysis Overall Interpretation: NEGATIVE
